# Patient Record
Sex: Female | Born: 1965 | Race: White | Hispanic: No | Marital: Single | Smoking: Current every day smoker
Health system: Southern US, Community
[De-identification: ages and names within clinical notes are randomized; demographics above are authoritative.]

## PROBLEM LIST (undated history)

## (undated) DIAGNOSIS — E039 Hypothyroidism, unspecified: Secondary | ICD-10-CM

## (undated) DIAGNOSIS — F319 Bipolar disorder, unspecified: Secondary | ICD-10-CM

## (undated) DIAGNOSIS — I82409 Acute embolism and thrombosis of unspecified deep veins of unspecified lower extremity: Secondary | ICD-10-CM

## (undated) HISTORY — PX: TONSILLECTOMY: SUR1361

---

## 2004-02-13 ENCOUNTER — Other Ambulatory Visit: Admission: RE | Admit: 2004-02-13 | Discharge: 2004-02-13 | Payer: Self-pay

## 2007-08-12 ENCOUNTER — Emergency Department (HOSPITAL_COMMUNITY): Admission: EM | Admit: 2007-08-12 | Discharge: 2007-08-12 | Payer: Self-pay | Admitting: Emergency Medicine

## 2017-12-03 ENCOUNTER — Encounter (HOSPITAL_COMMUNITY): Payer: Self-pay | Admitting: Internal Medicine

## 2017-12-03 ENCOUNTER — Inpatient Hospital Stay (HOSPITAL_COMMUNITY): Payer: Medicaid - Out of State

## 2017-12-03 ENCOUNTER — Inpatient Hospital Stay (HOSPITAL_COMMUNITY)
Admission: AD | Admit: 2017-12-03 | Discharge: 2017-12-15 | DRG: 987 | Disposition: A | Discharge status: Skilled Nursing Facility | Payer: Medicaid - Out of State | Source: Other Acute Inpatient Hospital | Attending: Internal Medicine | Admitting: Internal Medicine

## 2017-12-03 DIAGNOSIS — F17200 Nicotine dependence, unspecified, uncomplicated: Secondary | ICD-10-CM | POA: Diagnosis not present

## 2017-12-03 DIAGNOSIS — Z91128 Patient's intentional underdosing of medication regimen for other reason: Secondary | ICD-10-CM

## 2017-12-03 DIAGNOSIS — I10 Essential (primary) hypertension: Secondary | ICD-10-CM | POA: Diagnosis present

## 2017-12-03 DIAGNOSIS — R040 Epistaxis: Secondary | ICD-10-CM | POA: Diagnosis not present

## 2017-12-03 DIAGNOSIS — K709 Alcoholic liver disease, unspecified: Secondary | ICD-10-CM | POA: Diagnosis not present

## 2017-12-03 DIAGNOSIS — D65 Disseminated intravascular coagulation [defibrination syndrome]: Secondary | ICD-10-CM | POA: Diagnosis not present

## 2017-12-03 DIAGNOSIS — E872 Acidosis: Secondary | ICD-10-CM | POA: Diagnosis present

## 2017-12-03 DIAGNOSIS — M311 Thrombotic microangiopathy: Secondary | ICD-10-CM

## 2017-12-03 DIAGNOSIS — R0602 Shortness of breath: Secondary | ICD-10-CM | POA: Diagnosis not present

## 2017-12-03 DIAGNOSIS — F1920 Other psychoactive substance dependence, uncomplicated: Secondary | ICD-10-CM | POA: Diagnosis not present

## 2017-12-03 DIAGNOSIS — D759 Disease of blood and blood-forming organs, unspecified: Secondary | ICD-10-CM | POA: Diagnosis not present

## 2017-12-03 DIAGNOSIS — F10231 Alcohol dependence with withdrawal delirium: Secondary | ICD-10-CM | POA: Diagnosis present

## 2017-12-03 DIAGNOSIS — F1010 Alcohol abuse, uncomplicated: Secondary | ICD-10-CM | POA: Diagnosis not present

## 2017-12-03 DIAGNOSIS — G9341 Metabolic encephalopathy: Secondary | ICD-10-CM | POA: Diagnosis not present

## 2017-12-03 DIAGNOSIS — D696 Thrombocytopenia, unspecified: Secondary | ICD-10-CM | POA: Diagnosis not present

## 2017-12-03 DIAGNOSIS — Z781 Physical restraint status: Secondary | ICD-10-CM

## 2017-12-03 DIAGNOSIS — F19121 Other psychoactive substance abuse with intoxication delirium: Secondary | ICD-10-CM | POA: Diagnosis not present

## 2017-12-03 DIAGNOSIS — R740 Nonspecific elevation of levels of transaminase and lactic acid dehydrogenase [LDH]: Secondary | ICD-10-CM | POA: Diagnosis not present

## 2017-12-03 DIAGNOSIS — F1210 Cannabis abuse, uncomplicated: Secondary | ICD-10-CM | POA: Diagnosis present

## 2017-12-03 DIAGNOSIS — R7989 Other specified abnormal findings of blood chemistry: Secondary | ICD-10-CM | POA: Diagnosis present

## 2017-12-03 DIAGNOSIS — D6959 Other secondary thrombocytopenia: Secondary | ICD-10-CM | POA: Diagnosis present

## 2017-12-03 DIAGNOSIS — T370X5A Adverse effect of sulfonamides, initial encounter: Secondary | ICD-10-CM | POA: Diagnosis not present

## 2017-12-03 DIAGNOSIS — R339 Retention of urine, unspecified: Secondary | ICD-10-CM | POA: Diagnosis not present

## 2017-12-03 DIAGNOSIS — T381X6A Underdosing of thyroid hormones and substitutes, initial encounter: Secondary | ICD-10-CM | POA: Diagnosis present

## 2017-12-03 DIAGNOSIS — F1420 Cocaine dependence, uncomplicated: Secondary | ICD-10-CM | POA: Diagnosis not present

## 2017-12-03 DIAGNOSIS — F1410 Cocaine abuse, uncomplicated: Secondary | ICD-10-CM | POA: Diagnosis not present

## 2017-12-03 DIAGNOSIS — D592 Drug-induced nonautoimmune hemolytic anemia: Secondary | ICD-10-CM | POA: Diagnosis not present

## 2017-12-03 DIAGNOSIS — B1920 Unspecified viral hepatitis C without hepatic coma: Secondary | ICD-10-CM | POA: Diagnosis present

## 2017-12-03 DIAGNOSIS — G92 Toxic encephalopathy: Secondary | ICD-10-CM | POA: Diagnosis not present

## 2017-12-03 DIAGNOSIS — F17210 Nicotine dependence, cigarettes, uncomplicated: Secondary | ICD-10-CM | POA: Diagnosis present

## 2017-12-03 DIAGNOSIS — E871 Hypo-osmolality and hyponatremia: Secondary | ICD-10-CM | POA: Diagnosis present

## 2017-12-03 DIAGNOSIS — R791 Abnormal coagulation profile: Secondary | ICD-10-CM | POA: Diagnosis present

## 2017-12-03 DIAGNOSIS — M3119 Other thrombotic microangiopathy: Secondary | ICD-10-CM

## 2017-12-03 DIAGNOSIS — D649 Anemia, unspecified: Secondary | ICD-10-CM | POA: Diagnosis not present

## 2017-12-03 DIAGNOSIS — R5381 Other malaise: Secondary | ICD-10-CM | POA: Diagnosis not present

## 2017-12-03 DIAGNOSIS — D689 Coagulation defect, unspecified: Secondary | ICD-10-CM | POA: Diagnosis not present

## 2017-12-03 DIAGNOSIS — Z79899 Other long term (current) drug therapy: Secondary | ICD-10-CM

## 2017-12-03 DIAGNOSIS — Z79890 Hormone replacement therapy (postmenopausal): Secondary | ICD-10-CM

## 2017-12-03 DIAGNOSIS — F1910 Other psychoactive substance abuse, uncomplicated: Secondary | ICD-10-CM | POA: Diagnosis present

## 2017-12-03 DIAGNOSIS — R531 Weakness: Secondary | ICD-10-CM | POA: Diagnosis not present

## 2017-12-03 DIAGNOSIS — I959 Hypotension, unspecified: Secondary | ICD-10-CM | POA: Diagnosis not present

## 2017-12-03 DIAGNOSIS — E039 Hypothyroidism, unspecified: Secondary | ICD-10-CM | POA: Diagnosis present

## 2017-12-03 DIAGNOSIS — R945 Abnormal results of liver function studies: Secondary | ICD-10-CM | POA: Diagnosis not present

## 2017-12-03 DIAGNOSIS — F319 Bipolar disorder, unspecified: Secondary | ICD-10-CM | POA: Diagnosis present

## 2017-12-03 DIAGNOSIS — R34 Anuria and oliguria: Secondary | ICD-10-CM | POA: Diagnosis present

## 2017-12-03 DIAGNOSIS — T80212A Local infection due to central venous catheter, initial encounter: Secondary | ICD-10-CM

## 2017-12-03 DIAGNOSIS — N179 Acute kidney failure, unspecified: Secondary | ICD-10-CM | POA: Diagnosis present

## 2017-12-03 DIAGNOSIS — F1510 Other stimulant abuse, uncomplicated: Secondary | ICD-10-CM | POA: Diagnosis present

## 2017-12-03 DIAGNOSIS — F1110 Opioid abuse, uncomplicated: Secondary | ICD-10-CM | POA: Diagnosis not present

## 2017-12-03 DIAGNOSIS — T6591XA Toxic effect of unspecified substance, accidental (unintentional), initial encounter: Secondary | ICD-10-CM | POA: Diagnosis present

## 2017-12-03 DIAGNOSIS — K760 Fatty (change of) liver, not elsewhere classified: Secondary | ICD-10-CM | POA: Diagnosis present

## 2017-12-03 DIAGNOSIS — R410 Disorientation, unspecified: Secondary | ICD-10-CM | POA: Diagnosis present

## 2017-12-03 DIAGNOSIS — R251 Tremor, unspecified: Secondary | ICD-10-CM | POA: Diagnosis not present

## 2017-12-03 HISTORY — DX: Bipolar disorder, unspecified: F31.9

## 2017-12-03 HISTORY — DX: Hypothyroidism, unspecified: E03.9

## 2017-12-03 HISTORY — DX: Acute embolism and thrombosis of unspecified deep veins of unspecified lower extremity: I82.409

## 2017-12-03 LAB — URINALYSIS, COMPLETE (UACMP) WITH MICROSCOPIC
Bilirubin Urine: NEGATIVE
GLUCOSE, UA: NEGATIVE mg/dL
Ketones, ur: NEGATIVE mg/dL
LEUKOCYTES UA: NEGATIVE
NITRITE: NEGATIVE
PH: 5 (ref 5.0–8.0)
Protein, ur: NEGATIVE mg/dL
SPECIFIC GRAVITY, URINE: 1.006 (ref 1.005–1.030)

## 2017-12-03 LAB — COMPREHENSIVE METABOLIC PANEL
ALBUMIN: 2.1 g/dL — AB (ref 3.5–5.0)
ALT: 60 U/L — AB (ref 14–54)
AST: 161 U/L — AB (ref 15–41)
Alkaline Phosphatase: 78 U/L (ref 38–126)
Anion gap: 11 (ref 5–15)
BUN: 48 mg/dL — AB (ref 6–20)
CHLORIDE: 101 mmol/L (ref 101–111)
CO2: 17 mmol/L — ABNORMAL LOW (ref 22–32)
CREATININE: 2.73 mg/dL — AB (ref 0.44–1.00)
Calcium: 7.8 mg/dL — ABNORMAL LOW (ref 8.9–10.3)
GFR calc Af Amer: 22 mL/min — ABNORMAL LOW (ref >60)
GFR calc non Af Amer: 19 mL/min — ABNORMAL LOW (ref >60)
GLUCOSE: 118 mg/dL — AB (ref 65–99)
POTASSIUM: 3.8 mmol/L (ref 3.5–5.1)
Sodium: 129 mmol/L — ABNORMAL LOW (ref 135–145)
Total Bilirubin: 3.3 mg/dL — ABNORMAL HIGH (ref 0.3–1.2)
Total Protein: 3.8 g/dL — ABNORMAL LOW (ref 6.5–8.1)

## 2017-12-03 LAB — DIC (DISSEMINATED INTRAVASCULAR COAGULATION) PANEL
APTT: 31 s (ref 24–36)
D DIMER QUANT: 3.38 ug{FEU}/mL — AB (ref 0.00–0.50)
FIBRINOGEN: 152 mg/dL — AB (ref 210–475)
INR: 1.58
PROTHROMBIN TIME: 18.7 s — AB (ref 11.4–15.2)

## 2017-12-03 LAB — CBC WITH DIFFERENTIAL/PLATELET
BASOS ABS: 0 10*3/uL (ref 0.0–0.1)
BASOS PCT: 0 %
Eosinophils Absolute: 0 10*3/uL (ref 0.0–0.7)
Eosinophils Relative: 0 %
HCT: 28.6 % — ABNORMAL LOW (ref 36.0–46.0)
Hemoglobin: 9.7 g/dL — ABNORMAL LOW (ref 12.0–15.0)
Lymphocytes Relative: 13 %
Lymphs Abs: 0.8 10*3/uL (ref 0.7–4.0)
MCH: 33 pg (ref 26.0–34.0)
MCHC: 33.9 g/dL (ref 30.0–36.0)
MCV: 97.3 fL (ref 78.0–100.0)
MONO ABS: 0.2 10*3/uL (ref 0.1–1.0)
Monocytes Relative: 2 %
Neutro Abs: 5.5 10*3/uL (ref 1.7–7.7)
Neutrophils Relative %: 85 %
PLATELETS: 28 10*3/uL — AB (ref 150–400)
RBC: 2.94 MIL/uL — AB (ref 3.87–5.11)
RDW: 16.3 % — AB (ref 11.5–15.5)
WBC: 6.5 10*3/uL (ref 4.0–10.5)

## 2017-12-03 LAB — OSMOLALITY, URINE: OSMOLALITY UR: 299 mosm/kg — AB (ref 300–900)

## 2017-12-03 LAB — RETICULOCYTES
RBC.: 2.94 MIL/uL — ABNORMAL LOW (ref 3.87–5.11)
RETIC COUNT ABSOLUTE: 47 10*3/uL (ref 19.0–186.0)
RETIC CT PCT: 1.6 % (ref 0.4–3.1)

## 2017-12-03 LAB — T4, FREE: Free T4: 0.88 ng/dL (ref 0.61–1.12)

## 2017-12-03 LAB — CREATININE, URINE, RANDOM: Creatinine, Urine: 26.76 mg/dL

## 2017-12-03 LAB — OSMOLALITY: OSMOLALITY: 291 mosm/kg (ref 275–295)

## 2017-12-03 LAB — LACTIC ACID, PLASMA: LACTIC ACID, VENOUS: 1.7 mmol/L (ref 0.5–1.9)

## 2017-12-03 LAB — PHOSPHORUS: PHOSPHORUS: 4.8 mg/dL — AB (ref 2.5–4.6)

## 2017-12-03 LAB — DIC (DISSEMINATED INTRAVASCULAR COAGULATION)PANEL
Platelets: 28 10*3/uL — CL (ref 150–400)
Smear Review: NONE SEEN

## 2017-12-03 LAB — TSH: TSH: 7.108 u[IU]/mL — ABNORMAL HIGH (ref 0.350–4.500)

## 2017-12-03 LAB — NA AND K (SODIUM & POTASSIUM), RAND UR
Potassium Urine: 30 mmol/L
SODIUM UR: 79 mmol/L

## 2017-12-03 LAB — CORTISOL: Cortisol, Plasma: 60 ug/dL

## 2017-12-03 LAB — BRAIN NATRIURETIC PEPTIDE: B NATRIURETIC PEPTIDE 5: 235 pg/mL — AB (ref 0.0–100.0)

## 2017-12-03 LAB — MRSA PCR SCREENING: MRSA BY PCR: NEGATIVE

## 2017-12-03 LAB — MAGNESIUM: MAGNESIUM: 1.6 mg/dL — AB (ref 1.7–2.4)

## 2017-12-03 LAB — VITAMIN B12: VITAMIN B 12: 413 pg/mL (ref 180–914)

## 2017-12-03 LAB — CK: Total CK: 686 U/L — ABNORMAL HIGH (ref 38–234)

## 2017-12-03 LAB — TECHNOLOGIST SMEAR REVIEW: TECH REVIEW: NONE SEEN

## 2017-12-03 LAB — FOLATE: FOLATE: 3.6 ng/mL — AB (ref >5.9)

## 2017-12-03 LAB — ABO/RH: ABO/RH(D): A NEG

## 2017-12-03 LAB — LACTATE DEHYDROGENASE: LDH: 669 U/L — ABNORMAL HIGH (ref 98–192)

## 2017-12-03 MED ORDER — LORAZEPAM 2 MG/ML IJ SOLN
2.0000 mg | INTRAMUSCULAR | Status: DC | PRN
  Administered 2017-12-05: 2 mg via INTRAVENOUS

## 2017-12-03 MED ORDER — STERILE WATER FOR INJECTION IV SOLN
INTRAVENOUS | Status: DC
  Administered 2017-12-03: via INTRAVENOUS
  Filled 2017-12-03 (×2): qty 850

## 2017-12-03 MED ORDER — FOLIC ACID 1 MG PO TABS
1.0000 mg | ORAL_TABLET | Freq: Every day | ORAL | Status: DC
  Administered 2017-12-03 – 2017-12-15 (×12): 1 mg via ORAL
  Filled 2017-12-03 (×12): qty 1

## 2017-12-03 MED ORDER — SODIUM CHLORIDE 0.9 % IV SOLN: 1.0000 mg | Freq: Once | INTRAVENOUS | Status: DC

## 2017-12-03 MED ORDER — ACD FORMULA A 0.73-2.45-2.2 GM/100ML VI SOLN
500.0000 mL | Status: DC
  Administered 2017-12-04: 500 mL via INTRAVENOUS

## 2017-12-03 MED ORDER — ADULT MULTIVITAMIN W/MINERALS CH
1.0000 | ORAL_TABLET | Freq: Every day | ORAL | Status: DC
  Administered 2017-12-03 – 2017-12-15 (×11): 1 via ORAL
  Filled 2017-12-03 (×12): qty 1

## 2017-12-03 MED ORDER — LEVOTHYROXINE SODIUM 112 MCG PO TABS
112.0000 ug | ORAL_TABLET | Freq: Every day | ORAL | Status: DC
  Administered 2017-12-04 – 2017-12-15 (×10): 112 ug via ORAL
  Filled 2017-12-03 (×10): qty 1

## 2017-12-03 MED ORDER — VITAMIN B-1 100 MG PO TABS
100.0000 mg | ORAL_TABLET | Freq: Every day | ORAL | Status: DC
  Administered 2017-12-03 – 2017-12-15 (×12): 100 mg via ORAL
  Filled 2017-12-03 (×12): qty 1

## 2017-12-03 MED ORDER — METHYLPREDNISOLONE SODIUM SUCC 125 MG IJ SOLR
125.0000 mg | Freq: Four times a day (QID) | INTRAMUSCULAR | Status: DC
  Administered 2017-12-04 – 2017-12-05 (×6): 125 mg via INTRAVENOUS
  Filled 2017-12-03 (×6): qty 2

## 2017-12-03 MED ORDER — ACD FORMULA A 0.73-2.45-2.2 GM/100ML VI SOLN
Status: AC
  Filled 2017-12-03: qty 500

## 2017-12-03 MED ORDER — SODIUM CHLORIDE 0.9 % IV SOLN
2.0000 g | Freq: Once | INTRAVENOUS | Status: AC
  Administered 2017-12-04: 2 g via INTRAVENOUS
  Filled 2017-12-03: qty 20

## 2017-12-03 MED ORDER — DIPHENHYDRAMINE HCL 25 MG PO CAPS: 25.0000 mg | ORAL_CAPSULE | Freq: Four times a day (QID) | ORAL | Status: DC | PRN

## 2017-12-03 MED ORDER — ANTICOAGULANT SODIUM CITRATE 4% (200MG/5ML) IV SOLN
5.0000 mL | Freq: Once | Status: AC
  Administered 2017-12-04: 5 mL
  Filled 2017-12-03: qty 250
  Filled 2017-12-03: qty 5

## 2017-12-03 MED ORDER — SODIUM CHLORIDE 0.9% FLUSH
3.0000 mL | Freq: Two times a day (BID) | INTRAVENOUS | Status: DC
  Administered 2017-12-03 – 2017-12-15 (×18): 3 mL via INTRAVENOUS

## 2017-12-03 MED ORDER — ACD FORMULA A 0.73-2.45-2.2 GM/100ML VI SOLN
500.0000 mL | Status: DC
  Filled 2017-12-03: qty 500

## 2017-12-03 MED ORDER — NICOTINE 14 MG/24HR TD PT24
14.0000 mg | MEDICATED_PATCH | Freq: Every day | TRANSDERMAL | Status: DC
  Administered 2017-12-04 – 2017-12-12 (×5): 14 mg via TRANSDERMAL
  Filled 2017-12-03 (×13): qty 1

## 2017-12-03 MED ORDER — ACETAMINOPHEN 325 MG PO TABS
650.0000 mg | ORAL_TABLET | Freq: Four times a day (QID) | ORAL | Status: DC | PRN
  Administered 2017-12-05 – 2017-12-14 (×7): 650 mg via ORAL
  Filled 2017-12-03 (×7): qty 2

## 2017-12-03 MED ORDER — CYANOCOBALAMIN 1000 MCG/ML IJ SOLN
1000.0000 ug | Freq: Once | INTRAMUSCULAR | Status: AC
  Administered 2017-12-04: 1000 ug via SUBCUTANEOUS
  Filled 2017-12-03: qty 1

## 2017-12-03 MED ORDER — ACETAMINOPHEN 650 MG RE SUPP: 650.0000 mg | Freq: Four times a day (QID) | RECTAL | Status: DC | PRN

## 2017-12-03 MED ORDER — DIPHENHYDRAMINE HCL 25 MG PO CAPS
25.0000 mg | ORAL_CAPSULE | Freq: Four times a day (QID) | ORAL | Status: DC | PRN
  Administered 2017-12-04: 25 mg via ORAL

## 2017-12-03 MED ORDER — POLYETHYLENE GLYCOL 3350 17 G PO PACK
17.0000 g | PACK | Freq: Every day | ORAL | Status: DC | PRN
  Administered 2017-12-07 – 2017-12-10 (×2): 17 g via ORAL
  Filled 2017-12-03 (×2): qty 1

## 2017-12-03 MED ORDER — SODIUM CHLORIDE 0.9 % IV SOLN
INTRAVENOUS | Status: DC
  Administered 2017-12-03: 23:00:00 via INTRAVENOUS

## 2017-12-03 MED ORDER — MAGNESIUM SULFATE 2 GM/50ML IV SOLN
2.0000 g | Freq: Once | INTRAVENOUS | Status: AC
  Administered 2017-12-03: 2 g via INTRAVENOUS
  Filled 2017-12-03: qty 50

## 2017-12-03 MED ORDER — ANTICOAGULANT SODIUM CITRATE 4% (200MG/5ML) IV SOLN: 5.0000 mL | Freq: Once | Status: DC

## 2017-12-03 MED ORDER — CALCIUM GLUCONATE 10 % IV SOLN: 2.0000 g | Freq: Once | INTRAVENOUS | Status: DC

## 2017-12-03 MED ORDER — ONDANSETRON HCL 4 MG/2ML IJ SOLN: 4.0000 mg | Freq: Four times a day (QID) | INTRAMUSCULAR | Status: DC | PRN

## 2017-12-03 MED ORDER — ONDANSETRON HCL 4 MG PO TABS: 4.0000 mg | ORAL_TABLET | Freq: Four times a day (QID) | ORAL | Status: DC | PRN

## 2017-12-03 MED ORDER — ACETAMINOPHEN 325 MG PO TABS: 650.0000 mg | ORAL_TABLET | ORAL | Status: DC | PRN

## 2017-12-03 MED ORDER — FOLIC ACID 5 MG/ML IJ SOLN
1.0000 mg | Freq: Once | INTRAMUSCULAR | Status: AC
  Filled 2017-12-03: qty 0.2

## 2017-12-03 MED ORDER — CALCIUM CARBONATE ANTACID 500 MG PO CHEW
2.0000 | CHEWABLE_TABLET | ORAL | Status: DC
  Filled 2017-12-03: qty 2

## 2017-12-03 NOTE — Progress Notes (Signed)
[facility]. Patient alert to self. Foley in place and patent. RIJ infusing IVF. Patient has multiple bruises. CHG bath completed. Denies any pain or discomfort at this time.

## 2017-12-03 NOTE — Procedures (Signed)
Hemodialysis Catheter Insertion Procedure Note Claire LyonsKimberly C Salinas [id number] 08/05/1966  Procedure: Insertion of Hemodialysis Catheter Indications: Plasma Exchange  Procedure Details Consent: Unable to obtain consent because of altered level of consciousness. Time Out: Verified patient identification, verified procedure, site/side was marked, verified correct patient position, special equipment/implants available, medications/allergies/relevent history reviewed, required imaging and test results available.  Performed  Maximum sterile technique was used including antiseptics, cap, gloves, gown, hand hygiene, mask and sheet. Skin prep: Chlorhexidine; local anesthetic administered A antimicrobial bonded/coated single lumen catheter was placed in the left internal jugular vein using the Seldinger technique.  Evaluation Blood flow good Complications: No apparent complications Patient did tolerate procedure well. Chest X-ray ordered to verify placement.  CXR: pending.  Procedure performed under direct ultrasound guidance for real time vessel cannulation.      Rutherford Guysahul Carlos Heber, [place] - [facility] Pgr: [phone]  or [phone] 12/03/2017, 10:56 PM

## 2017-12-03 NOTE — H&P (Addendum)
[facility] History and Physical   Patient: Claire Salinas:[id number]   PCP: Kela Millin, MD DOB: 11-Jun-1966   DOA: 12/03/2017   DOS: 12/03/2017   DOS: the patient was seen and examined on 12/03/2017  Patient coming from: The patient is coming from [facility],  Chief Complaint: she presented from home generalized weakness and confusion.  HPI: Claire Salinas is a 52 y.o. female with Past medical history of hypothyroidism, DVT, bipolar disorder.   Patient initially presented with [facility] home ER as she was unable to urinate for last 2 days as well as unable to walk and very weak and confused at home.  Her presentation date was December 02, 2017.  Also has nausea on arrival there.  Recently seen in November 23, 2017 for At which time treated with Bactrim and UTI. Smokes 1 pack a day.  Drinks beer or wine and liquor, 8 shortness of vodka daily.  History of polysubstance abuse including marijuana, cocaine, meth, methadone.  Also reported easy bruising that started recently. Home medication includes Seroquel 200 mg daily, Neurontin 100 mg 3 times daily, levothyroxine 112 MCG, diclofenac gel and potassium.   Platelets on admission was 114,000, repeat was 55,000 with schistocytes on smear and therefore the patient was sent to [facility]. Renal Ultrasound Shows No Evidence of Hydronephrosis Bilaterally, Left Kidney 7.7 on the right kidney 8.3. Chest x-ray was performed after right IJ line placement no acute abnormality. CT abdomen and pelvis with contrast November 23, 2017 shows fatty liver  Her labs in the other facility were as following. Reticulocyte count 1.73,  WBC 8.9, hemoglobin 11.8, platelet 55. creatinine 2.49, BUN 46, calcium 8.1, total bilirubin 2.1, direct bilirubin 0.8, indirect 1.3. Sodium 130, potassium 4.4, chloride 100, anion gap 20,  INR 1.4, APTT 26.1, LDH 676. urine analysis trace protein, ketones, blood, bilirubin, leukocyte esterase, negative  nitrites. urine sodium 21, urine creatinine 317.9, increased nucleated RBC, schistocytes on smear. pH 7.39, PCO2 21, PO2 87, bicarb 12.7. Lactic acid 2.0.  3.3 on admission. UDS positive for amphetamine, cocaine, negative for barbiturates, benzodiazepine, cannabinoids, methadone, opiates. Ammonia 37,  albumin 2.7, total protein 4.7, AST 121, ALT 55. TSH 8.83,  CK 508,  NT proBNP 3104,  urine culture grew E. coli on 11/23/2017 Received pantoprazole, normal saline bolus, heparin for central line flushes. Bactrim 3 days Didn't Take Synthroid for 3 weeks prior to arrival.  Patient was admitted with above complaints, was given aggressive IV hydration with at least 3 L of normal saline as well as LR.  Patient was also given IV bicarb infusion. Nephrology was consulted, underwent ultrasound of the kidneys which were unremarkable.  Due to worsening platelets patient was sent to [facility] for further workup.  Patient is unable to provide any history or ROS.  She tells me that she is here because of "all her problems".  On further questioning she Repeating Phenergan Works Better  At her baseline ambulates with support And is independent for most of her ADL; manages her medication on her own.  Review of Systems: as mentioned in the history of present illness.  All other systems reviewed and are negative.  Past Medical History:  Diagnosis Date  . Bipolar disorder (HCC)   . DVT (deep venous thrombosis) (HCC)   . Hypothyroidism    Past Surgical History:  Procedure Laterality Date  . TONSILLECTOMY     Social History:  reports that she has been smoking cigarettes.  She  has been smoking about 1.00 pack per day. She does not have any smokeless tobacco history on file. She reports that she drinks about 33.6 oz of alcohol per week. She reports that she uses drugs. Drugs: Cocaine, Marijuana, Methamphetamines, and Amphetamines.  No Known Allergies  Patient is unable to provide any further  family history. No family history on file.   Prior to Admission medications   Not on File    Physical Exam: Vitals:   12/03/17 1724  BP: 103/71  Pulse: (!) 118  Resp: 16  Temp: 98.4 F (36.9 C)  TempSrc: Oral  SpO2: 100%    General: Alert, Awake and Oriented to Time, Place and Person.  Although loses focus quickly and keeps on repeating same line again and again.  Appear in moderate distress, affect flat Eyes: PERRL, Conjunctiva normal ENT: Oral Mucosa clear moist. Neck: difficult to assess JVD, no Abnormal Mass Or lumps Cardiovascular: S1 and S2 Present, no Murmur, Peripheral Pulses Present Respiratory: normal respiratory effort, Bilateral Air entry equal and Decreased, no use of accessory muscle, basal Crackles, no wheezes Abdomen: Bowel Sound present, Soft and no tenderness, no hernia Skin: no redness, multiple bruises at various stages of healing, no other rash, no induration Extremities: trace Pedal edema, no calf tenderness Neurologic: mental status AAOx3, speech normal, attention abnormal, needs multiple redirection as well as keeps on repeating same and again and again. Cranial Nerves PERRL, EOM normal and present, Motor strength bilateral equal, 3 out of 5. Sensation present to painful stimuli,  Reflexes difficult to elicit knee and biceps, babinski difficult to elicit,  Cerebellar test difficult to elicit. Gait not checked due to patient safety concerns.  Labs on Admission:  CBC: No results for input(s): WBC, NEUTROABS, HGB, HCT, MCV, PLT in the last 168 hours. Basic Metabolic Panel: No results for input(s): NA, K, CL, CO2, GLUCOSE, BUN, CREATININE, CALCIUM, MG, PHOS in the last 168 hours. GFR: CrCl cannot be calculated (No order found.). Liver Function Tests: No results for input(s): AST, ALT, ALKPHOS, BILITOT, PROT, ALBUMIN in the last 168 hours. No results for input(s): LIPASE, AMYLASE in the last 168 hours. No results for input(s): AMMONIA in the last 168  hours. Coagulation Profile: No results for input(s): INR, PROTIME in the last 168 hours. Cardiac Enzymes: No results for input(s): CKTOTAL, CKMB, CKMBINDEX, TROPONINI in the last 168 hours. BNP (last 3 results) No results for input(s): PROBNP in the last 8760 hours. HbA1C: No results for input(s): HGBA1C in the last 72 hours. CBG: No results for input(s): GLUCAP in the last 168 hours. Lipid Profile: No results for input(s): CHOL, HDL, LDLCALC, TRIG, CHOLHDL, LDLDIRECT in the last 72 hours. Thyroid Function Tests: No results for input(s): TSH, T4TOTAL, FREET4, T3FREE, THYROIDAB in the last 72 hours. Anemia Panel: No results for input(s): VITAMINB12, FOLATE, FERRITIN, TIBC, IRON, RETICCTPCT in the last 72 hours. Urine analysis: No results found for: COLORURINE, APPEARANCEUR, LABSPEC, PHURINE, GLUCOSEU, HGBUR, BILIRUBINUR, KETONESUR, PROTEINUR, UROBILINOGEN, NITRITE, LEUKOCYTESUR  Radiological Exams on Admission: No results found.  Assessment/Plan 1. AKI (acute kidney injury) (HCC) With anuria. Patient presented from outside hospital with Foley catheter in place. Continue strict in and out. No obstructive uropathy on ultrasound identified. Recheck UA. Patient is currently on normal saline with bicarb infusion, would like to transition her to normal saline. Nephrology consulted will follow recommendation. Most likely combination of prerenal as well as renal etiology. Mild elevation of CK was also seen in the other facility. TTP-HUS is high in differential  as well. Patient may have hypertension since she has been positive for cocaine at home which might have caused renal insufficiency.  2.  Thrombocytopenia. Abnormal INR. Concern for MAHA Patient was primarily transferred to [facility] because for concern for TTP HUS-like syndrome. Hematology has been consulted. Drug-induced possibility due to patient being on Bactrim as well as multiple substance abuse. Hypertension  from cocaine can be also possibility. Infection cannot be ruled out. Workup is initiated here, hematologist has been consulted, appreciate input from Dr. Mosetta Putt He does not appear to be compensating and therefore will monitor clinically overnight.  3.  Acute metabolic encephalopathy. Etiology not clear, probably multifactorial. TTP-HUS, acute metabolic encephalopathy from acute kidney injury, delirium tremens from alcohol withdrawal, substance abuse, hypothyroidism with noncompliance with Synthroid.  All these are actively presenting this patient. We will get CT head to rule out any acute intracranial abnormality. May require MRI to rule out the same as well. PT OT consulted. Avoid psychotropic medications unless indicated. Metabolic workup also initiated including ruling out infection. Patient did pass a swallow evaluation at bedside.  4.  Hypothyroidism. Noncompliance with Synthroid. TSH was 8.18 at the facility. recheck TSH and free T4. May require IV Synthroid. Check cortisol level before that.  May need to give IV Solu-Medrol before that.  5.  Alcohol abuse. Drinks 8 glasses of vodka on daily basis. CIWA protocol. Patient would be in window.  For delirium as well as delirium tremens in the next 24-72 hours.  6.  Polysubstance abuse. Monitor for now. CIWA protocol.  7.  Multiple bruises. Patient denies any injury or assault. Likely from thrombocytopenia as well as ataxia. Monitor. Would need to rediscuss regarding harm at home once patient's mentation is clear.  8.  Active smoker. Nicotine patch for now. Unable to discuss quitting and counseling due to mental status changes.  9.  Elevated LFT. Fatty liver. AST ALT elevation consistent with alcoholic liver disease. Bilirubin elevated with indirect bilirubin primarily elevated consistent with hemolysis. Mild elevation of LDH as well. Recheck the labs. Hep B and hep C. Avoid hepatotoxic medication, Tylenol less than 2 g  okay.  10.  Central line. Presenting from outside facility with a central line present in right IJ. Chest x-ray.  11.  Suspected UTI. Patient was treated with some IV antibiotics in the outside facility I do not have any information available regarding this on MAR. Currently will monitor clinically.   Nutrition: renal diet DVT Prophylaxis: mechanical compression device  Advance goals of care discussion: full code presumed   Consults: Nephrology, Dr. Lowell Guitar. Hematology, Dr. Mosetta Putt  Family Communication: no family was present at bedside, at the time of interview.  Disposition: Admitted as inpatient step-down unit. Likely to be discharged home, in 3-4 days. The patient is critically ill with multiple organ systems failure and requires high complexity decision making for assessment and support, frequent evaluation and titration of therapies. Critical Care Time devoted to patient care services described in this note is 60 minutes   Author: Lynden Oxford, MD [facility] Pager: [phone] 12/03/2017  If 7PM-7AM, please contact night-coverage [email or web address] Password [email or web address]

## 2017-12-03 NOTE — Consult Note (Signed)
Name: Claire Salinas MRN: [id number] DOB: June 17, 1966    ADMISSION DATE:  12/03/2017 CONSULTATION DATE:  12/03/17  REFERRING MD :  Mosetta Putt  CHIEF COMPLAINT:  AMS, hypotension   HISTORY OF PRESENT ILLNESS:  Claire Salinas is a 52 y.o. female with a PMH as outlined below.  She was transferred from [facility] to [facility] for concern of TTP.  She had been seen in [facility] ED 12/24 for right arm numbness and weakness.  Found to have UTI so prescribed Bactrim.  Returned 1/2 with worsening confusion.  Found to have anemia, thrombocytopenia, AKI.  She was subsequently transferred to [facility] for initiation of plasma exchange.  Overnight 1/3, hematology had concern that she needed transfer to ICU for hypotension and AMS.  Lowest SBP has been 94.  Current BP 105/75 with MAP 86.  Pt is awake, moving all extremities, but does not answer questions or follow commands.   PAST MEDICAL HISTORY :   has a past medical history of Bipolar disorder (HCC), DVT (deep venous thrombosis) (HCC), and Hypothyroidism.  has a past surgical history that includes Tonsillectomy. Prior to Admission medications   Medication Sig Start Date End Date Taking? Authorizing Provider  levothyroxine (SYNTHROID, LEVOTHROID) 112 MCG tablet Take 112 mcg by mouth daily before breakfast.   Yes [provider]   No Known Allergies  FAMILY HISTORY:  family history is not on file. SOCIAL HISTORY:  reports that she has been smoking cigarettes.  She has been smoking about 1.00 pack per day. She does not have any smokeless tobacco history on file. She reports that she drinks about 33.6 oz of alcohol per week. She reports that she uses drugs. Drugs: Cocaine, Marijuana, Methamphetamines, and Amphetamines.  REVIEW OF SYSTEMS:  Unable to obtain as pt is encephalopathic.   SUBJECTIVE:  MAP 86.  Pt awake but unable to answer questions.  VITAL SIGNS: Temp:  [98.4 F (36.9 C)-98.5 F (36.9 C)] 98.5 F (36.9 C) (01/03 2013) Pulse Rate:  [118]  118 (01/03 1724) Resp:  [16] 16 (01/03 1724) BP: (103)/(71) 103/71 (01/03 1724) SpO2:  [100 %] 100 % (01/03 1724) Weight:  [70.5 kg (155 lb 6.8 oz)] 70.5 kg (155 lb 6.8 oz) (01/03 1940)  PHYSICAL EXAMINATION: General: Adult female, resting in bed, in NAD. Neuro: Awake, unable to answer questions, moves all extremities, non-focal.  HEENT: [place]/AT. PERRL, sclerae anicteric. Cardiovascular: RRR, no M/R/G.  Lungs: Respirations even and unlabored.  CTA bilaterally, No W/R/R. Abdomen: BS x 4, soft, NT/ND.  Musculoskeletal: No gross deformities, no edema.  Skin: Intact, warm, no rashes.    Recent Labs  Lab 12/03/17 1926  NA 129*  K 3.8  CL 101  CO2 17*  BUN 48*  CREATININE 2.73*  GLUCOSE 118*   Recent Labs  Lab 12/03/17 1926  HGB 9.7*  HCT 28.6*  WBC 6.5  PLT PENDING  28*   Dg Chest Port 1 View  Result Date: 12/03/2017 CLINICAL DATA:  Shortness of breath EXAM: PORTABLE CHEST 1 VIEW COMPARISON:  None. FINDINGS: A right central venous catheter is in place with tip projected over the right clavicular head. The catheter is somewhat laterally positioned but this is likely due to patient rotation. Tip is probably in the upper SVC region. Prominent right paratracheal soft tissues. This is likely vascular but lymphadenopathy is a less likely consideration. No pneumothorax. No blunting of costophrenic angles. No focal consolidation. Linear atelectasis or fibrosis in the left lung base. Heart size and pulmonary vascularity are normal.  IMPRESSION: Right central venous catheter tip is in the upper SVC region. Prominent right paratracheal soft tissues are likely vascular. Lymphadenopathy is a less likely consideration. Linear atelectasis or fibrosis in the left lung base. Electronically Signed   By: Burman NievesWilliam  Stevens M.D.   On: 12/03/2017 20:58    STUDIES:  CXR 1/3 > atelectasis.  SIGNIFICANT EVENTS  1[facility]  ASSESSMENT / PLAN:  Concern for hypotension - current MAP  86. Plan: Continue fluids. No need for ICU transfer at this point.  TTP vs ITP vs DIC. Plan: Continue plasma exchange per hematology. Continue steroids. F/u [id number].  AKI. Hyponatremia - ? Beer potomania. NAGMA. Hypomagnesemia - s/p repletion. Plan: Continue plasma exchange and fluids (started bicarb). Follow BMP.  Acute encephalopathy - likely primarily due to TTP but possibly multifactorial given hx EtOH and polysubstance abuse. Polysubstance abuse - UDS at OSH positive for amphetamine, cocaine.  Plan: Continue thiamine / folate. F/u CT head per primary team. Polysubstance counseling.  No need for ICU transfer at this point.  OK to continue supportive care in SDU with initiation of plasma exchange.  PCCM will sign off, pease call back if needs arise.   Rutherford Guysahul Desai, [place] - [facility] Pager: [phone]  or [phone] 12/03/2017, 10:39 PM

## 2017-12-03 NOTE — Significant Event (Addendum)
Rapid Response Event Note:   Overview: Call Time: 2153 Arrival Time: 2155  Initial Focused Assessment: Neurologic  Per RN, mental status has been fluctuating, at times patient is more responsive, has been confused.  When I walked, patient arouse to voice, followed my commands, could not fully lift any extremity off the bed on command but did to painful stimuli, grips were equal, overall generalized weakness, no gaze, pupils are reactive bilaterally, able to protect airway. Heart and lungs sounds clear, good air movement. CCM PA arrived while I was there to place vas-cath. Nurse appreciates ataxia, I did not. Hematology MD saw patient prior to my arrival. Patient does follow commands but seems to follow them inconsistently (behavior?).   Patient was admitted for multiple issues, AKI (not making urine -? HD), Thrombocytopenia (TTP? HIT ? plasmapheresis - ?), did have a routine CT, will change to STAT for now.   Interventions: -- CT STAT (was routine).   Plan of Care (if not transferred): -- Patient will get CT STAT and then set up for plasmapheresis.  -- Hematology MD consulted CCM, CCM PA was bedside placing vascath -- Will look for CT results once they are reported, CT HEAD was negative -- I briefly saw patient in HD, plasma exchange was completed, patient still confused but more awake.  Event Summary:  End Time 2245  Claire Salinas R

## 2017-12-03 NOTE — Consult Note (Addendum)
[facility]  [phone]   HEMATOLOGY ONCOLOGY INPATIENT CONSULTATION   Claire Salinas  DOB: Sep 17, 1966  MR#: [id number]  CSN#: [id number]    Requesting Physician: [facility] Dr. Allena Katz   Patient Care Team: Kela Millin, MD as PCP - General (Family Medicine)  Reason for consult: possible TTP   History of present illness:   Patient is a 52 year old female, with past medical history of bipolar, polysubstance abuse, hypothyroidism, DVT, [facility].  Patient is confused, disoriented, not able to give history.  Eyes spoke with her aunt and her daughter, and reviewed her chart.  She presented to [facility] on November 23, 2017 for right arm numbness, and generalized weakness.  She was found to have UTI and was treated with Bactrim.  She returned to emergency room yesterday with worsening confusion, unable to void, no fever, lab reviewed mild anemia and thrombocytopenia with platelet count 114k, acute renal failure with creatinine 2.49.  She was admitted, repeated CBC this morning showed platelet 55K, which was transferred to [facility].  Her CBC showed platelet count 28K tonight.  Peripheral smear showed positive schistocytes.   MEDICAL HISTORY:  Past Medical History:  Diagnosis Date  . Bipolar disorder (HCC)   . DVT (deep venous thrombosis) (HCC)   . Hypothyroidism     SURGICAL HISTORY: Past Surgical History:  Procedure Laterality Date  . TONSILLECTOMY      SOCIAL HISTORY: Social History   Socioeconomic History  . Marital status: Single    Spouse name: Not on file  . Number of children: Not on file  . Years of education: Not on file  . Highest education level: Not on file  Social Needs  . Financial resource strain: Not on file  . Food insecurity - worry: Not on file  . Food insecurity - inability: Not on file  . Transportation needs - medical: Not on file  . Transportation needs -  non-medical: Not on file  Occupational History  . Not on file  Tobacco Use  . Smoking status: Current Every Day Smoker    Packs/day: 1.00    Types: Cigarettes  Substance and Sexual Activity  . Alcohol use: Yes    Alcohol/week: 33.6 oz    Types: 56 Shots of liquor per week    Comment: Vodka  . Drug use: Yes    Types: Cocaine, Marijuana, Methamphetamines, Amphetamines  . Sexual activity: Not on file  Other Topics Concern  . Not on file  Social History Narrative  . Not on file    FAMILY HISTORY: No family history on file.  ALLERGIES:  has No Known Allergies.  MEDICATIONS:  Current Facility-Administered Medications  Medication Dose Route Frequency Provider Last Rate Last Dose  . acetaminophen (TYLENOL) tablet 650 mg  650 mg Oral Q6H PRN Rolly Salter, MD       Or  . acetaminophen (TYLENOL) suppository 650 mg  650 mg Rectal Q6H PRN Rolly Salter, MD      . folic acid (FOLVITE) tablet 1 mg  1 mg Oral Daily Rolly Salter, MD   1 mg at 12/03/17 2058  . [START ON 12/04/2017] levothyroxine (SYNTHROID, LEVOTHROID) tablet 112 mcg  112 mcg Oral QAC breakfast Rolly Salter, MD      . LORazepam (ATIVAN) injection 2-3 mg  2-3 mg Intravenous Q1H PRN Rolly Salter, MD      . multivitamin with minerals tablet 1 tablet  1 tablet Oral Daily Rolly SalterPatel, Pranav M, MD   1 tablet at 12/03/17 2057  . nicotine (NICODERM CQ - dosed in mg/24 hours) patch 14 mg  14 mg Transdermal Daily Rolly SalterPatel, Pranav M, MD      . ondansetron [facility](ZOFRAN) tablet 4 mg  4 mg Oral Q6H PRN Rolly SalterPatel, Pranav M, MD       Or  . ondansetron [facility]ZOFRAN) injection 4 mg  4 mg Intravenous Q6H PRN Rolly SalterPatel, Pranav M, MD      . polyethylene glycol (MIRALAX / GLYCOLAX) packet 17 g  17 g Oral Daily PRN Rolly SalterPatel, Pranav M, MD      . sodium chloride flush (NS) 0.9 % injection 3 mL  3 mL Intravenous Q12H Rolly SalterPatel, Pranav M, MD      . thiamine (VITAMIN B-1) tablet 100 mg  100 mg Oral Daily Rolly SalterPatel, Pranav M, MD   100 mg at 12/03/17 2058    REVIEW OF SYSTEMS:   (unable to obtain due to her delirium)  PHYSICAL EXAMINATION: ECOG PERFORMANCE STATUS:   Vitals:   12/03/17 1724 12/03/17 2013  BP: 103/71   Pulse: (!) 118   Resp: 16   Temp: 98.4 F (36.9 C) 98.5 F (36.9 C)  SpO2: 100%    Filed Weights   12/03/17 1940  Weight: 155 lb 6.8 oz (70.5 kg)    GENERAL: Drowsy, arousable, disoriented, able to state her name, does not answer questions appropriately. SKIN: Mild to moderate ecchymosis on her bilateral arms, left lateral leg, and congenital area.  EYES: normal, conjunctiva are pink and non-injected, sclera clear OROPHARYNX:no exudate, no erythema and lips, buccal mucosa, and tongue normal  NECK: supple, thyroid normal size, non-tender, without nodularity LYMPH:  no palpable lymphadenopathy in the cervical, axillary or inguinal LUNGS: clear to auscultation and percussion with normal breathing effort HEART: regular rate & rhythm and no murmurs and no lower extremity edema ABDOMEN:abdomen soft, non-tender and normal bowel sounds Musculoskeletal: (+) Significant bilateral upper extremity edema, mild edema on her legs. PSYCH: alert & oriented x 3 with fluent speech NEURO: disorented, able to move all extremities, does not follow commands.  LABORATORY DATA:  I have reviewed the data as listed Lab Results  Component Value Date   WBC 6.5 12/03/2017   HGB 9.7 (L) 12/03/2017   HCT 28.6 (L) 12/03/2017   MCV 97.3 12/03/2017   PLT 28 (LL) 12/03/2017   Recent Labs    12/03/17 1926  NA 129*  K 3.8  CL 101  CO2 17*  GLUCOSE 118*  BUN 48*  CREATININE 2.73*  CALCIUM 7.8*  GFRNONAA 19*  GFRAA 22*  PROT 3.8*  ALBUMIN 2.1*  AST 161*  ALT 60*  ALKPHOS 78  BILITOT 3.3*    RADIOGRAPHIC STUDIES: I have personally reviewed the radiological images as listed and agreed with the findings in the report. Dg Chest Port 1 View  Result Date: 12/03/2017 CLINICAL DATA:  Shortness of breath EXAM: PORTABLE CHEST 1 VIEW COMPARISON:  None.  FINDINGS: A right central venous catheter is in place with tip projected over the right clavicular head. The catheter is somewhat laterally positioned but this is likely due to patient rotation. Tip is probably in the upper SVC region. Prominent right paratracheal soft tissues. This is likely vascular but lymphadenopathy is a less likely consideration. No pneumothorax. No blunting of costophrenic angles. No focal consolidation. Linear atelectasis or fibrosis in the left lung base. Heart size and pulmonary vascularity are normal. IMPRESSION: Right central venous catheter  tip is in the upper SVC region. Prominent right paratracheal soft tissues are likely vascular. Lymphadenopathy is a less likely consideration. Linear atelectasis or fibrosis in the left lung base. Electronically Signed   By: Burman Nieves M.D.   On: 12/03/2017 20:58    ASSESSMENT & PLAN: Patient is a 52 year old female, with past medical history of bipolar, polysubstance abuse, hypothyroidism, DVT, [facility].  1. 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 Acute delirium  3.  AKI 4.  Polysubstance abuse, including alcohol, cocaine, marijuana, methadone 5. Hyponatremia  6.  Transaminitis, possibly related to alcohol and substance abuse   Recommendations: -I have reviewed her peripheral blood smear, which showed mild polychromasia, and increased schistocytes 2-4/hp -Outside and our lab results reviewed, she has elevated indirect bilirubin, LDH, acute renal failure, mental status change, this is concerning for TTP, although her reticulocyte count is not elevated, which is against hemolysis.  -the other possibility including DIC and ITP, or drug-induced thrombocytopenia (bacterim or polysubstance abuse), she does have low fibrinogen and slightly elevated PT/PTT -I recommend line placement and plasma exchange to be started as soon as possible.  I have spoke with critical care team for line  placement, and nephrology team for plasma exchange tonight -We will start Solu-Medrol 125 mg every 6 hours, plan to taper the dose when her platelet counts recovers well -ADAMTS13 was sent out tonight -Patient is critically ill, I recommend her to be evaluated by critical care team, I spoke with Dr. Darrick Penna  -I spoke with pt's aunt and daughter, consent obtained from her daughter over the phone, witnessed by pt's RN Harrold Donath  -I will f/u closely    All questions were answered. The patient knows to call the clinic with any problems, questions or concerns.      Malachy Mood, MD 12/03/2017 9:29 PM  Cell [phone]

## 2017-12-04 ENCOUNTER — Inpatient Hospital Stay (HOSPITAL_COMMUNITY): Payer: Medicaid - Out of State

## 2017-12-04 ENCOUNTER — Encounter (HOSPITAL_COMMUNITY): Payer: Self-pay | Admitting: *Deleted

## 2017-12-04 DIAGNOSIS — R0602 Shortness of breath: Secondary | ICD-10-CM

## 2017-12-04 DIAGNOSIS — M3119 Other thrombotic microangiopathy: Secondary | ICD-10-CM

## 2017-12-04 DIAGNOSIS — M311 Thrombotic microangiopathy: Secondary | ICD-10-CM

## 2017-12-04 DIAGNOSIS — T80212A Local infection due to central venous catheter, initial encounter: Secondary | ICD-10-CM

## 2017-12-04 LAB — PROTIME-INR
INR: 1.14
PROTHROMBIN TIME: 14.5 s (ref 11.4–15.2)

## 2017-12-04 LAB — THERAPEUTIC PLASMA EXCHANGE (BLOOD BANK)
Plasma Exchange: 1501
Plasma volume needed: 1500
UNIT DIVISION: 0
UNIT DIVISION: 0
UNIT DIVISION: 0
UNIT DIVISION: 0
Unit division: 0
Unit division: 0

## 2017-12-04 LAB — URINE CULTURE: Culture: NO GROWTH

## 2017-12-04 LAB — POCT I-STAT, CHEM 8
BUN: 40 mg/dL — ABNORMAL HIGH (ref 6–20)
CALCIUM ION: 1.24 mmol/L (ref 1.15–1.40)
CHLORIDE: 103 mmol/L (ref 101–111)
Creatinine, Ser: 2.7 mg/dL — ABNORMAL HIGH (ref 0.44–1.00)
Glucose, Bld: 111 mg/dL — ABNORMAL HIGH (ref 65–99)
HCT: 29 % — ABNORMAL LOW (ref 36.0–46.0)
Hemoglobin: 9.9 g/dL — ABNORMAL LOW (ref 12.0–15.0)
Potassium: 4 mmol/L (ref 3.5–5.1)
SODIUM: 131 mmol/L — AB (ref 135–145)
TCO2: 17 mmol/L — AB (ref 22–32)

## 2017-12-04 LAB — RENAL FUNCTION PANEL
ALBUMIN: 2.2 g/dL — AB (ref 3.5–5.0)
Anion gap: 9 (ref 5–15)
BUN: 44 mg/dL — ABNORMAL HIGH (ref 6–20)
CALCIUM: 8.2 mg/dL — AB (ref 8.9–10.3)
CO2: 23 mmol/L (ref 22–32)
Chloride: 101 mmol/L (ref 101–111)
Creatinine, Ser: 2.31 mg/dL — ABNORMAL HIGH (ref 0.44–1.00)
GFR, EST AFRICAN AMERICAN: 27 mL/min — AB (ref >60)
GFR, EST NON AFRICAN AMERICAN: 23 mL/min — AB (ref >60)
GLUCOSE: 109 mg/dL — AB (ref 65–99)
PHOSPHORUS: 4 mg/dL (ref 2.5–4.6)
POTASSIUM: 3.7 mmol/L (ref 3.5–5.1)
SODIUM: 133 mmol/L — AB (ref 135–145)

## 2017-12-04 LAB — CBC
HEMATOCRIT: 25.3 % — AB (ref 36.0–46.0)
HEMOGLOBIN: 8.5 g/dL — AB (ref 12.0–15.0)
MCH: 32.8 pg (ref 26.0–34.0)
MCHC: 33.6 g/dL (ref 30.0–36.0)
MCV: 97.7 fL (ref 78.0–100.0)
Platelets: 24 10*3/uL — CL (ref 150–400)
RBC: 2.59 MIL/uL — AB (ref 3.87–5.11)
RDW: 16.5 % — ABNORMAL HIGH (ref 11.5–15.5)
WBC: 4 10*3/uL (ref 4.0–10.5)

## 2017-12-04 LAB — MAGNESIUM: MAGNESIUM: 2 mg/dL (ref 1.7–2.4)

## 2017-12-04 LAB — HIV ANTIBODY (ROUTINE TESTING W REFLEX): HIV Screen 4th Generation wRfx: NONREACTIVE

## 2017-12-04 MED ORDER — DIPHENHYDRAMINE HCL 25 MG PO CAPS: 25.0000 mg | ORAL_CAPSULE | Freq: Four times a day (QID) | ORAL | Status: DC | PRN

## 2017-12-04 MED ORDER — LACTULOSE 10 GM/15ML PO SOLN
20.0000 g | Freq: Every day | ORAL | Status: DC
  Administered 2017-12-04 – 2017-12-12 (×7): 20 g via ORAL
  Filled 2017-12-04 (×11): qty 30

## 2017-12-04 MED ORDER — ANTICOAGULANT SODIUM CITRATE 4% (200MG/5ML) IV SOLN
5.0000 mL | Freq: Once | Status: AC
  Administered 2017-12-04: 5 mL
  Filled 2017-12-04: qty 250

## 2017-12-04 MED ORDER — ACD FORMULA A 0.73-2.45-2.2 GM/100ML VI SOLN
Status: AC
  Filled 2017-12-04: qty 500

## 2017-12-04 MED ORDER — SODIUM CHLORIDE 0.9 % IV SOLN
2.0000 g | Freq: Once | INTRAVENOUS | Status: AC
  Administered 2017-12-04: 2 g via INTRAVENOUS
  Filled 2017-12-04: qty 20

## 2017-12-04 MED ORDER — CALCIUM CARBONATE ANTACID 500 MG PO CHEW
CHEWABLE_TABLET | ORAL | Status: AC
  Administered 2017-12-04: 400 mg via ORAL
  Filled 2017-12-04: qty 4

## 2017-12-04 MED ORDER — DIPHENHYDRAMINE HCL 25 MG PO CAPS
ORAL_CAPSULE | ORAL | Status: AC
  Filled 2017-12-04: qty 1

## 2017-12-04 MED ORDER — ACETAMINOPHEN 325 MG PO TABS: 650.0000 mg | ORAL_TABLET | ORAL | Status: DC | PRN

## 2017-12-04 MED ORDER — THIAMINE HCL 100 MG/ML IJ SOLN
500.0000 mg | Freq: Once | INTRAVENOUS | Status: AC
  Administered 2017-12-04: 500 mg via INTRAVENOUS
  Filled 2017-12-04: qty 5

## 2017-12-04 MED ORDER — ACD FORMULA A 0.73-2.45-2.2 GM/100ML VI SOLN
500.0000 mL | Status: DC
  Filled 2017-12-04: qty 500

## 2017-12-04 MED ORDER — ORAL CARE MOUTH RINSE
15.0000 mL | Freq: Two times a day (BID) | OROMUCOSAL | Status: DC
  Administered 2017-12-04 – 2017-12-15 (×17): 15 mL via OROMUCOSAL

## 2017-12-04 MED ORDER — CALCIUM CARBONATE ANTACID 500 MG PO CHEW
2.0000 | CHEWABLE_TABLET | ORAL | Status: DC
  Administered 2017-12-04: 400 mg via ORAL

## 2017-12-04 MED ORDER — LACTATED RINGERS IV SOLN
INTRAVENOUS | Status: AC
  Administered 2017-12-04: 10:00:00 via INTRAVENOUS

## 2017-12-04 NOTE — Procedures (Signed)
EEG Report  Clinical History:  Renal failure, hemolytic anemia, thrombocytopenia, and mental status changes.  Suspect TTP.  ADAMTS13 pending.    Technical Summary:  A 19 channel digital EEG recording was performed using the 10-20 international system of electrode placement.  Bipolar and Referential montages were used.  The total recording time was approx 20 minutes.  Findings:  There is no posterior dominant rhythm.  Background frequencies are in the 5-6 Hz range and symmetrical.  There are intermittent triphasic waveforms present in a symmetrical and synchronous fashion.  No epileptiform discharges or electrographic seizures are present.   Impression:  This is an abnormal EEG.  There is moderate generalized slowing of brain activity and characteristic waveforms consistent with a metabolic, toxic, infectious, or hypoxic encephalopathy.  Clinical correlation is recommended.  The patient is not in non-convulsive status epilepticus.    Weston SettleShervin Herschell Virani, MS, MD

## 2017-12-04 NOTE — Progress Notes (Addendum)
Pt. Exhibiting fluctuation in LOC and mental status. Pt. more difficult to arouse and difficulty maintaining eye contact. Pt. exhibiting additional weakness in extremities and inconsistently following commands. Rapid response nurse was contacted, and responded to bedside. Orders placed. RN will continue to monitor.
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
## 2017-12-04 NOTE — Consult Note (Signed)
Geoffery LyonsKimberly C Coppedge Admit Date: 12/03/2017 12/04/2017 Arita MissSANFORD, RYAN B Requesting Physician:  Allena KatzPatel MD  Reason for Consult:  AKI HPI:  52 year old female who [facility] and transferred to [facility] yesterday evening for evaluation of acute kidney injury with thrombocytopenia.  PMH Incudes:  Bipolar disorder currently on Seroquel and Neurontin  Hypothyroidism on levothyroxine   history of DVT  Polysubstance abuse, outside toxicology screen positive for cocaine, amphetamines, barbiturates, cannabinoids  Tobacco user  Per chart patient was seen on 12/24 at an outside emergency room and treated for a urinary tract infection with TMP/SMX.  She then represented on 1/2 with inability to walk, progressive weakness, confusion, nausea and vomiting.  Her creatinine appears to be 1.1 at least on baseline in late December.  Upon presentation her creatinine was increased to 2.49 with a BUN of 46, potassium 4.4, sodium 130.  She had progressive thrombocytopenia with a value of 55 and a hemoglobin of 11.8 at the outside facility.  She was seen by nephrology at the outside hospital.  Renal ultrasound by report was performed demonstrating no hydronephrosis.  Foley catheter also has been placed.  Upon arrival the patient has had confusion.  Blood pressures are not elevated.  There is no diarrhea.  No fevers.  Her thrombocytopenia has worsened to 24 and her hemoglobin is 8.5 with an LDH of 669 (elevated), INR 1.58, elevated d-dimer.  Her PTT is within normal limits.  Urinalysis is without proteinuria, hematuria.  She was seen by hematology overnight and given that TTP is a possible diagnosis has been started on plasmapheresis.  Testing for ADAMTS 13 and complements are pending.  She has made 0.5 L of urine since admission with more in the bag at the current time.  This morning her creatinine is 2.70 with a BUN of 40 and normal potassium.  She is on a D5 with  sodium bicarbonate continuous infusion currently.   Creatinine, Ser (mg/dL)  Date Value  16/10/960401/03/2018 2.31 (H)  12/04/2017 2.70 (H)  12/03/2017 2.73 (H)  ] I/Os:  ROS NSAIDS: unknown IV Contrast no idenitfied exposure TMP/SMX was prescribed 12/24 for urinary tract infection, unclear if she took over the time courses. Hypotension not present Balance of 12 systems is negative w/ exceptions as above  PMH  Past Medical History:  Diagnosis Date  . Bipolar disorder (HCC)   . DVT (deep venous thrombosis) (HCC)   . Hypothyroidism    PSH  Past Surgical History:  Procedure Laterality Date  . TONSILLECTOMY     FH No family history on file. SH  reports that she has been smoking cigarettes.  She has been smoking about 1.00 pack per day. She does not have any smokeless tobacco history on file. She reports that she drinks about 33.6 oz of alcohol per week. She reports that she uses drugs. Drugs: Cocaine, Marijuana, Methamphetamines, and Amphetamines. Allergies No Known Allergies Home medications Prior to Admission medications   Medication Sig Start Date End Date Taking? Authorizing Provider  levothyroxine (SYNTHROID, LEVOTHROID) 112 MCG tablet Take 112 mcg by mouth daily before breakfast.   Yes [provider]    Current Medications Scheduled Meds: . folic acid  1 mg Oral Daily  . [COMPLETED] folic acid  1 mg Intravenous Once  . levothyroxine  112 mcg Oral QAC breakfast  . methylPREDNISolone (SOLU-MEDROL) injection  125 mg Intravenous Q6H  . multivitamin with minerals  1 tablet Oral Daily  . nicotine  14 mg Transdermal Daily  . sodium chloride flush  3 mL Intravenous Q12H  . thiamine  100 mg Oral Daily   Continuous Infusions: . citrate dextrose    .  sodium bicarbonate (isotonic) infusion in sterile water 75 mL/hr at 12/03/17 2351   PRN Meds:.acetaminophen **OR** acetaminophen, acetaminophen, diphenhydrAMINE, LORazepam, ondansetron **OR** ondansetron (ZOFRAN) IV,  polyethylene glycol  CBC Recent Labs  Lab 12/03/17 1926 12/04/17 0011 12/04/17 0500  WBC 6.5  --  4.0  NEUTROABS 5.5  --   --   HGB 9.7* 9.9* 8.5*  HCT 28.6* 29.0* 25.3*  MCV 97.3  --  97.7  PLT 28*  28*  --  24*   Basic Metabolic Panel Recent Labs  Lab 12/03/17 1926 12/04/17 0011 12/04/17 0500  NA 129* 131* 133*  K 3.8 4.0 3.7  CL 101 103 101  CO2 17*  --  23  GLUCOSE 118* 111* 109*  BUN 48* 40* 44*  CREATININE 2.73* 2.70* 2.31*  CALCIUM 7.8*  --  8.2*  PHOS 4.8*  --  4.0    Physical Exam  Blood pressure 120/81, pulse (!) 108, temperature 98.3 F (36.8 C), temperature source Axillary, resp. rate 10, height 5\' 3"  (1.6 m), weight 71.6 kg (157 lb 13.6 oz), SpO2 98 %. GEN: somnolent, awakens to voice. ENT: NCAT EYES: EOMI CV: RRR  PULM: CTAB ant auscultation ABD: s/nt/nd SKIN: scattered brusing throughout trunk / extremities EXT:No LEE   Assessment 37F admitted 1/2 to OSH with AKI (BL SCr 1.1?), progressive TCP, AMS, in setting of BPAD and ongoing active substance abuse  1. Oliguric AKI, OSH Renal US reported no obstruction/HN and foley catheter placed; BL SCr at least 1.1.  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 MAHA / TCP; hematology following; very possible this is TTP and TPE orderes written by hematology, starting today.  ? If could be related to quetiapine, did she inject narcotics/opana (reported in literature).  No diarrhea reported, BP is not elevated.  C3 and C4 and ADAMTS13 pending.  INR is mildly increased and fibrinogen depressed/Ddmier up.   3. AMS, probably related to #2, substance use issues 4. BPAD on seroquel and gabapentin as outpatient; both on hold here 5. Hypothyroidism 6. Polysubstance abuse; OSH + for amphetamines, barbituates, THC, cocaine  7. Tobacco use  Plan 1. TPE and steroids per hematology 2. No HD needed currently,Good UOP, Lytes ok 3. Cont supportive care, change IVfs to LR from NaHCO3 4. Daily  weights, Daily Renal Panel, Strict I/Os, Avoid nephrotoxins (NSAIDs, judicious IV Contrast) 5. Will follow closely   Sabra Heck MD [phone] pgr 12/04/2017, 9:44 AM

## 2017-12-04 NOTE — Progress Notes (Signed)
Physical Therapy EVALUATION  Clinical Assessment: Pt presented supine in bed with HOB elevated, asleep and remained lethargic throughout. Pt with minimal arousal with movement in bed and to EOB. Pt unable to provide any history information regarding living situation or PLOF. No family/caregivers present during evaluation. Pt currently requires total A x2 for bed mobility and min-max A x2 to maintain upright sitting position. Pt would continue to benefit from skilled physical therapy services at this time while admitted and after d/c to address the below listed limitations in order to improve overall safety and independence with functional mobility.  Of note, with movement pt became tachycardic (HR as high as low 140's). Pt's RN was present throughout evaluation and aware.    12/04/17 0800  PT Visit Information  Last PT Received On 12/04/17  Assistance Needed +2  PT/OT/SLP Co-Evaluation/Treatment Yes  Reason for Co-Treatment Necessary to address cognition/behavior during functional activity;For patient/therapist safety;To address functional/ADL transfers  PT goals addressed during session Mobility/safety with mobility;Balance;Strengthening/ROM  History of Present Illness Pt is [age] y/o female admitted as a transfer from [facility] for concern of TTP.  She was seen in [place] ED on 12/24 for right arm numbness and weakness. Pt was found to have a UTI and prescribed Bactrim. She returned on 1/2 with worsening confusion. Pt found to have anemia, thrombocytopenia and an AKI. PMH including but not limited to Bipolar disorder and polysubstance abuse.  Precautions  Precautions Fall  Restrictions  Weight Bearing Restrictions No  Home Living  Family/patient expects to be discharged to: Unsure  Additional Comments pt too lethargic to provide any information, no family/caregivers present  Prior Function  Comments unsure, see above comment  Communication  Communication No difficulties  Pain  Assessment  Pain Assessment No/denies pain  Cognition  Arousal/Alertness Lethargic  Behavior During Therapy Flat affect  Overall Cognitive Status Difficult to assess  Area of Impairment Attention;Following commands;Problem solving  Current Attention Level Focused  Following Commands Follows one step commands with increased time;Follows one step commands inconsistently  Problem Solving Slow processing;Decreased initiation;Difficulty sequencing;Requires verbal cues;Requires tactile cues  Difficult to assess due to Level of arousal  Upper Extremity Assessment  Upper Extremity Assessment Defer to OT evaluation  Lower Extremity Assessment  Lower Extremity Assessment Generalized weakness  Bed Mobility  Overal bed mobility Needs Assistance  Bed Mobility Supine to Sit;Sit to Supine  Supine to sit Total assist;+2 for physical assistance  Sit to supine Total assist;+2 for physical assistance  General bed mobility comments total A for all aspects secondary to pt too lethargic  Transfers  General transfer comment unable  Balance  Overall balance assessment Needs assistance  Sitting-balance support Feet supported  Sitting balance-Leahy Scale Poor  Sitting balance - Comments fluctuating between requiring max A to min A x2; pt with one LOB posteriorly  Postural control Posterior lean  PT - End of Session  Activity Tolerance Patient limited by lethargy  Patient left in bed;with call bell/phone within reach;with bed alarm set  Nurse Communication Mobility status;Need for lift equipment  PT Assessment  PT Recommendation/Assessment Patient needs continued PT services  PT Visit Diagnosis Other abnormalities of gait and mobility (R26.89);Muscle weakness (generalized) (M62.81)  PT Problem List Decreased strength;Decreased activity tolerance;Decreased balance;Decreased mobility;Decreased coordination;Decreased cognition;Decreased safety awareness;Decreased knowledge of precautions  PT Plan  PT  Frequency (ACUTE ONLY) Min 2X/week  PT Treatment/Interventions (ACUTE ONLY) DME instruction;Stair training;Gait training;Functional mobility training;Therapeutic activities;Therapeutic exercise;Balance training;Neuromuscular re-education;Cognitive remediation;Patient/family education  AM-PAC PT "6 Clicks" Daily Activity Outcome  Measure  Difficulty turning over in bed (including adjusting bedclothes, sheets and blankets)? 1  Difficulty moving from lying on back to sitting on the side of the bed?  1  Difficulty sitting down on and standing up from a chair with arms (e.g., wheelchair, bedside commode, etc,.)? 1  Help needed moving to and from a bed to chair (including a wheelchair)? 1  Help needed walking in hospital room? 1  Help needed climbing 3-5 steps with a railing?  1  6 Click Score 6  Mobility G Code  CN  PT Recommendation  Follow Up Recommendations SNF;Supervision/Assistance - 24 hour  PT equipment None recommended by PT  Individuals Consulted  Consulted and Agree with Results and Recommendations Patient unable/family or caregiver not available  Acute Rehab PT Goals  Patient Stated Goal unable to state  PT Goal Formulation Patient unable to participate in goal setting  Time For Goal Achievement 12/18/17  Potential to Achieve Goals Fair  PT Time Calculation  PT Start Time (ACUTE ONLY) 0820  PT Stop Time (ACUTE ONLY) 0981  PT Time Calculation (min) (ACUTE ONLY) 13 min  PT General Charges  $$ ACUTE PT VISIT 1 Visit  PT Evaluation  $PT Eval Moderate Complexity 1 [address] [place], [place] [phone]

## 2017-12-04 NOTE — Care Management Note (Signed)
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                 Action/Plan: NCM will follow for dc needs.   Expected Discharge Date:                  Expected Discharge Plan:     In-House Referral:     Discharge planning Services  CM Consult  Post Acute Care Choice:    Choice offered to:     DME Arranged:    DME Agency:     HH Arranged:    HH Agency:     Status of Service:  In process, will continue to follow  If discussed at Long Length of Stay Meetings, dates discussed:    Additional Comments:  Leone Havenaylor, Ayen Viviano Clinton, RN 12/04/2017, 2:20 PM

## 2017-12-04 NOTE — Progress Notes (Signed)
Claire Salinas   DOB:1965-12-04   DT#:[id number]   DXI#:[id number]  Hematology follow up note   Subjective: Patient underwent first plasma exchange last night, and the second session this afternoon, tolerated both very well.  She is much more awake, but still disoriented, repeatedly saying the same sentence, and does not answer questions appropriately.  Her vital signs stable, blood pressure improved, has good urine output.   Objective:  Vitals:   12/04/17 1900 12/04/17 1921  BP: 123/76   Pulse: (!) 117   Resp: 19   Temp:  98.5 F (36.9 C)  SpO2: 97%     Body mass index is 27.96 kg/m.  Intake/Output Summary (Last 24 hours) at 12/04/2017 2047 Last data filed at 12/04/2017 1926 Gross per 24 hour  Intake 839.25 ml  Output 1200 ml  Net -360.75 ml     Sclerae unicteric  Oropharynx clear  No peripheral adenopathy  Lungs clear -- no rales or rhonchi  Heart regular rate and rhythm  Abdomen benign  MSK no focal spinal tenderness, (+) anasarca in all extremities   Neuro disoriented, does not follow commands, moves all extremities   CBG (last 3)  No results for input(s): GLUCAP in the last 72 hours.   Labs:  CBC Latest Ref Rng & Units 12/04/2017 12/04/2017 12/03/2017  WBC 4.0 - 10.5 K/uL 4.0 - -  Hemoglobin 12.0 - 15.0 g/dL 8.5(L) 9.9(L) -  Hematocrit 36.0 - 46.0 % 25.3(L) 29.0(L) -  Platelets 150 - 400 K/uL 24(LL) - 28(LL)    CMP Latest Ref Rng & Units 12/04/2017 12/04/2017 12/03/2017  Glucose 65 - 99 mg/dL 109(H) 111(H) 118(H)  BUN 6 - 20 mg/dL 44(H) 40(H) 48(H)  Creatinine 0.44 - 1.00 mg/dL 2.31(H) 2.70(H) 2.73(H)  Sodium 135 - 145 mmol/L 133(L) 131(L) 129(L)  Potassium 3.5 - 5.1 mmol/L 3.7 4.0 3.8  Chloride 101 - 111 mmol/L 101 103 101  CO2 22 - 32 mmol/L 23 - 17(L)  Calcium 8.9 - 10.3 mg/dL 8.2(L) - 7.8(L)  Total Protein 6.5 - 8.1 g/dL - - 3.8(L)  Total Bilirubin 0.3 - 1.2 mg/dL - - 3.3(H)  Alkaline Phos 38 - 126 U/L - - 78  AST 15 - 41 U/L - - 161(H)  ALT 14 - 54 U/L - - 60(H)     Urine Studies No results for input(s): UHGB, CRYS in the last 72 hours.  Invalid input(s): UACOL, UAPR, USPG, UPH, UTP, UGL, UKET, UBIL, UNIT, UROB, ULEU, UEPI, UWBC, URBC, UBAC, CAST, [place], [place]  Basic Metabolic Panel: Recent Labs  Lab 12/03/17 1926 12/04/17 0011 12/04/17 0500  NA 129* 131* 133*  K 3.8 4.0 3.7  CL 101 103 101  CO2 17*  --  23  GLUCOSE 118* 111* 109*  BUN 48* 40* 44*  CREATININE 2.73* 2.70* 2.31*  CALCIUM 7.8*  --  8.2*  MG 1.6*  --  2.0  PHOS 4.8*  --  4.0   GFR Estimated Creatinine Clearance: 27 mL/min (A) (by C-G formula based on SCr of 2.31 mg/dL (H)). Liver Function Tests: Recent Labs  Lab 12/03/17 1926 12/04/17 0500  AST 161*  --   ALT 60*  --   ALKPHOS 78  --   BILITOT 3.3*  --   PROT 3.8*  --   ALBUMIN 2.1* 2.2*   No results for input(s): LIPASE, AMYLASE in the last 168 hours. No results for input(s): AMMONIA in the last 168 hours. Coagulation profile Recent Labs  Lab 12/03/17 1926 12/04/17 0500  INR 1.58 1.14    CBC: Recent Labs  Lab 12/03/17 1926 12/04/17 0011 12/04/17 0500  WBC 6.5  --  4.0  NEUTROABS 5.5  --   --   HGB 9.7* 9.9* 8.5*  HCT 28.6* 29.0* 25.3*  MCV 97.3  --  97.7  PLT 28*  28*  --  24*   Cardiac Enzymes: Recent Labs  Lab 12/03/17 1926  CKTOTAL 686*   BNP: Invalid input(s): POCBNP CBG: No results for input(s): GLUCAP in the last 168 hours. D-Dimer Recent Labs    12/03/17 1926  DDIMER 3.38*   Hgb A1c No results for input(s): HGBA1C in the last 72 hours. Lipid Profile No results for input(s): CHOL, HDL, LDLCALC, TRIG, CHOLHDL, LDLDIRECT in the last 72 hours. Thyroid function studies Recent Labs    12/03/17 1830  TSH 7.108*   Anemia work up Recent Labs    12/03/17 1830 12/03/17 1926  VITAMINB12  --  45  FOLATE 3.6*  --   RETICCTPCT  --  1.6   Microbiology Recent Results (from the past 240 hour(s))  MRSA PCR Screening     Status: None   Collection Time: 12/03/17  5:33 PM   Result Value Ref Range Status   MRSA by PCR NEGATIVE NEGATIVE Final    Comment:        The GeneXpert MRSA Assay (FDA approved for NASAL specimens only), is one component of a comprehensive MRSA colonization surveillance program. It is not intended to diagnose MRSA infection nor to guide or monitor treatment for MRSA infections.   Culture, Urine     Status: None   Collection Time: 12/03/17  6:15 PM  Result Value Ref Range Status   Specimen Description URINE, RANDOM  Final   Special Requests NONE  Final   Culture NO GROWTH  Final   Report Status 12/04/2017 FINAL  Final  Culture, blood (routine x 2)     Status: None (Preliminary result)   Collection Time: 12/03/17  9:03 PM  Result Value Ref Range Status   Specimen Description BLOOD LEFT ANTECUBITAL  Final   Special Requests IN PEDIATRIC BOTTLE Blood Culture adequate volume  Final   Culture NO GROWTH < 24 HOURS  Final   Report Status PENDING  Incomplete  Culture, blood (routine x 2)     Status: None (Preliminary result)   Collection Time: 12/03/17  9:03 PM  Result Value Ref Range Status   Specimen Description BLOOD RIGHT HAND  Final   Special Requests IN PEDIATRIC BOTTLE Blood Culture adequate volume  Final   Culture NO GROWTH < 24 HOURS  Final   Report Status PENDING  Incomplete      Studies:  Ct Head Wo Contrast  Result Date: 12/03/2017 CLINICAL DATA:  Initial evaluation for acute altered mental status. EXAM: CT HEAD WITHOUT CONTRAST TECHNIQUE: Contiguous axial images were obtained from the base of the skull through the vertex without intravenous contrast. COMPARISON:  None available. FINDINGS: Brain: Cerebral volume within normal limits for patient age. Mild chronic small vessel ischemic changes within the cerebral white matter. No evidence for acute intracranial hemorrhage. No findings to suggest acute large vessel territory infarct. No mass lesion, midline shift, or mass effect. Ventricles are normal in size without  evidence for hydrocephalus. No extra-axial fluid collection identified. Vascular: No hyperdense vessel identified. Skull: Scalp soft tissues demonstrate no acute abnormality.Calvarium intact. Soft tissue calcification noted at the left frontal scalp near the vertex, of doubtful significance. Sinuses/Orbits: Globes and orbital soft  tissues are within normal limits. Visualized paranasal sinuses are clear. No mastoid effusion. IMPRESSION: 1. No acute intracranial abnormality. 2. Mild chronic microvascular ischemic disease. Electronically Signed   By: Jeannine Boga M.D.   On: 12/03/2017 23:22   Dg Chest Port 1 View  Result Date: 12/03/2017 CLINICAL DATA:  Shortness of breath EXAM: PORTABLE CHEST 1 VIEW COMPARISON:  None. FINDINGS: A right central venous catheter is in place with tip projected over the right clavicular head. The catheter is somewhat laterally positioned but this is likely due to patient rotation. Tip is probably in the upper SVC region. Prominent right paratracheal soft tissues. This is likely vascular but lymphadenopathy is a less likely consideration. No pneumothorax. No blunting of costophrenic angles. No focal consolidation. Linear atelectasis or fibrosis in the left lung base. Heart size and pulmonary vascularity are normal. IMPRESSION: Right central venous catheter tip is in the upper SVC region. Prominent right paratracheal soft tissues are likely vascular. Lymphadenopathy is a less likely consideration. Linear atelectasis or fibrosis in the left lung base. Electronically Signed   By: Lucienne Capers M.D.   On: 12/03/2017 20:58   Dg Chest Port 1v Same Day  Result Date: 12/03/2017 CLINICAL DATA:  Central line insertion.  Site infection. EXAM: PORTABLE CHEST 1 VIEW COMPARISON:  12/03/2017 FINDINGS: Since the previous study, there is interval placement of a left central venous catheter with tip over the low SVC region. No pneumothorax. An indwelling right central venous catheter is  unchanged in position with tip over the upper SVC region. Shallow inspiration with linear atelectasis in the lung bases. Heart size and pulmonary vascularity are normal. Prominent right paratracheal soft tissue is likely vascular and may be related to patient rotation. IMPRESSION: Left central venous catheter with tip over the low SVC region. Right central venous catheter with tip over the upper SVC region. Shallow inspiration with atelectasis in lung bases. Electronically Signed   By: Lucienne Capers M.D.   On: 12/03/2017 22:52    Assessment: 52 y.o.  female, with past medical history of bipolar, polysubstance abuse, hypothyroidism, DVT, was transferred from outside hospital for presumed TTP.  1. 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 Acute metabolic encephalopathy, multifactorial, including TTP, AKI, polysubstance abuse, elevated amonia, and hypothyroidism 3.  AKI 4.  Polysubstance abuse, including alcohol, cocaine, marijuana, methadone 5. Hyponatremia  6.  Transaminitis, possibly related to alcohol and substance abuse 7. Coagulapathy (low fibrinogen, mildly elevated PT), DIC vs liver etiology  8.  Transaminitis and elevated amonia, likely secondary to alcohol and polysubstance abuse   Recommendations: -I think TTP is still likely the case, but her lab findings were not all consistent with TTP (low ret count, few schistocytes on smear), she does have some lab evidence of DIC, and medication (bactrim) and all subsequent substance induced thrombocytopenia also possibility. Regardless, given her severe acute encephalopathy, I would continue current TTP treatment, before ADMTS13 result came back. Her mental status has improved since last night, much more awake  -continue daily and Solu-Medrol 125 mg every 6 hours -Daily CBC+ret, will repeat fibrinogen and LDH tomorrow  -I have checked with lab and confirmed that ADMTS13 was sent to [place] this morning, result is pending  -no platelet  transfusion unless significant bleeding -if hb below 8.0, will consider blood transfusion -will f/u daily    Truitt Merle, MD 12/04/2017

## 2017-12-04 NOTE — Evaluation (Signed)
Occupational Therapy Evaluation Patient Details Name: Geoffery LyonsKimberly C Key MRN: [id number] DOB: 06/24/1966 Today's Date: 12/04/2017    History of Present Illness Geoffery LyonsKimberly C Shelton is a 52 y.o. female with Past medical history of hypothyroidism, DVT, bipolar disorder, alcohol; uses drugs. Drugs: Cocaine, Marijuana, Methamphetamines, and Amphetamines. [facility] for concern of TTP.  She had been [facility]/24 for right arm numbness and weakness.  Found to have UTI so prescribed Bactrim.  Returned 1/2 with worsening confusion.  Found to have anemia, thrombocytopenia, AKI. Overnight 1/3, hematology had concern that she needed transfer to ICU for hypotension and AMS.   Clinical Impression   Pt with limited eval due to lethargy. Pt total A +2 for bed mobility at this time and total A for all ADL at this time. Therapists attempted different positions (gravity elminated, supported sitting EOB) to increase participation and arousal. No BUE movement at this time to multimodal cues with edema noted. Pillows placed under arms at EOS. Pt will require skilled OT in the acute setting with SNF level therapy afterwards (will monitor for progress if lethargy improves).     Follow Up Recommendations  SNF;Supervision/Assistance - 24 hour;Other (comment)(watch for progress)    Equipment Recommendations  Other (comment)(defer to next venue)    Recommendations for Other Services       Precautions / Restrictions Precautions Precautions: Fall Restrictions Weight Bearing Restrictions: No      Mobility Bed Mobility Overal bed mobility: Needs Assistance Bed Mobility: Supine to Sit;Sit to Supine     Supine to sit: Total assist;+2 for physical assistance Sit to supine: Total assist;+2 for physical assistance   General bed mobility comments: total A for all aspects secondary to pt too lethargic  Transfers                 General transfer comment: unsafe/unable  to attempt    Balance Overall balance assessment: Needs assistance Sitting-balance support: Feet supported Sitting balance-Leahy Scale: Poor Sitting balance - Comments: fluctuating between requiring max A to min A; pt with one LOB posteriorly Postural control: Posterior lean                                 ADL either performed or assessed with clinical judgement   ADL Overall ADL's : Needs assistance/impaired                                       General ADL Comments: Pt is currently a total A for all ADL at this time despite multimodal cueing, HOH attempts to get the Pt to participate, different positions (sitting EOB, supported in supine/gravity eliminated)     Vision Patient Visual Report: Other (comment)(unsure of prior vision)       Perception     Praxis      Pertinent Vitals/Pain Pain Assessment: No/denies pain     Hand Dominance     Extremity/Trunk Assessment Upper Extremity Assessment Upper Extremity Assessment: RUE deficits/detail;LUE deficits/detail;Generalized weakness RUE Deficits / Details: no active motion noted, edema present RUE: (unable to fully assess due to arousal) RUE Coordination: decreased fine motor;decreased gross motor LUE Deficits / Details: no active motion noted, edema present LUE: (unable to fully assess due to arousal) LUE Coordination: decreased fine motor;decreased gross motor   Lower Extremity Assessment Lower Extremity  Assessment: Defer to PT evaluation       Communication Communication Communication: No difficulties   Cognition Arousal/Alertness: Lethargic Behavior During Therapy: Flat affect Overall Cognitive Status: Difficult to assess Area of Impairment: Attention;Following commands;Problem solving                   Current Attention Level: Focused   Following Commands: Follows one step commands with increased time;Follows one step commands inconsistently     Problem Solving: Slow  processing;Decreased initiation;Difficulty sequencing;Requires verbal cues;Requires tactile cues General Comments: Pt was able to shake head that she did not want food, responds mostly yes or no to questions, but inconsistent in response.    General Comments       Exercises     Shoulder Instructions      Home Living Family/patient expects to be discharged to:: Unsure                                 Additional Comments: pt too lethargic to provide any information, no family/caregivers present      Prior Functioning/Environment          Comments: unsure, see above comment        OT Problem List: Decreased strength;Decreased activity tolerance;Decreased range of motion;Impaired balance (sitting and/or standing);Decreased coordination;Decreased cognition;Decreased safety awareness;Impaired UE functional use;Increased edema      OT Treatment/Interventions: Self-care/ADL training;Therapeutic exercise;Neuromuscular education;Manual therapy;DME and/or AE instruction;Therapeutic activities;Cognitive remediation/compensation;Visual/perceptual remediation/compensation;Patient/family education;Balance training    OT Goals(Current goals can be found in the care plan section) Acute Rehab OT Goals Patient Stated Goal: unable to state OT Goal Formulation: Patient unable to participate in goal setting ADL Goals Pt Will Perform Grooming: with mod assist;bed level Additional ADL Goal #1: Pt will sit EOB with min guard assist for 10 min to participate in ADL activities Additional ADL Goal #2: Pt will follow simple one step commands 50% of the time  OT Frequency: Min 2X/week   Barriers to D/C:            Co-evaluation PT/OT/SLP Co-Evaluation/Treatment: Yes Reason for Co-Treatment: Necessary to address cognition/behavior during functional activity;For patient/therapist safety;To address functional/ADL transfers PT goals addressed during session: Mobility/safety with  mobility OT goals addressed during session: ADL's and self-care      AM-PAC PT "6 Clicks" Daily Activity     Outcome Measure Help from another person eating meals?: Total Help from another person taking care of personal grooming?: Total Help from another person toileting, which includes using toliet, bedpan, or urinal?: Total Help from another person bathing (including washing, rinsing, drying)?: Total Help from another person to put on and taking off regular upper body clothing?: Total Help from another person to put on and taking off regular lower body clothing?: Total 6 Click Score: 6   End of Session Equipment Utilized During Treatment: Oxygen Nurse Communication: Mobility status;Precautions  Activity Tolerance: Patient limited by lethargy Patient left: in bed;with call bell/phone within reach;with nursing/sitter in room;with bed alarm set  OT Visit Diagnosis: Unsteadiness on feet (R26.81);Other abnormalities of gait and mobility (R26.89);Muscle weakness (generalized) (M62.81);Other symptoms and signs involving cognitive function                Time: 1610-9604 OT Time Calculation (min): 16 min Charges:  OT General Charges $OT Visit: 1 Visit OT Evaluation $OT Eval Moderate Complexity: 1 Mod G-Codes:     Sherryl Manges OTR/L [phone]  Evern Bio Mya Suell 12/04/2017,  9:13 AM

## 2017-12-04 NOTE — Progress Notes (Signed)
EEG completed; results pending.    

## 2017-12-05 LAB — RENAL FUNCTION PANEL
ALBUMIN: 2.6 g/dL — AB (ref 3.5–5.0)
ANION GAP: 9 (ref 5–15)
BUN: 34 mg/dL — ABNORMAL HIGH (ref 6–20)
CHLORIDE: 101 mmol/L (ref 101–111)
CO2: 27 mmol/L (ref 22–32)
Calcium: 9.7 mg/dL (ref 8.9–10.3)
Creatinine, Ser: 1.44 mg/dL — ABNORMAL HIGH (ref 0.44–1.00)
GFR calc Af Amer: 47 mL/min — ABNORMAL LOW (ref >60)
GFR calc non Af Amer: 41 mL/min — ABNORMAL LOW (ref >60)
GLUCOSE: 105 mg/dL — AB (ref 65–99)
PHOSPHORUS: 3 mg/dL (ref 2.5–4.6)
Potassium: 3.1 mmol/L — ABNORMAL LOW (ref 3.5–5.1)
Sodium: 137 mmol/L (ref 135–145)

## 2017-12-05 LAB — THERAPEUTIC PLASMA EXCHANGE (BLOOD BANK)
Plasma Exchange: 2589
Plasma volume needed: 2583
UNIT DIVISION: 0
UNIT DIVISION: 0
UNIT DIVISION: 0
UNIT DIVISION: 0
UNIT DIVISION: 0
Unit division: 0
Unit division: 0
Unit division: 0
Unit division: 0
Unit division: 0

## 2017-12-05 LAB — POCT I-STAT, CHEM 8
BUN: 30 mg/dL — AB (ref 6–20)
CALCIUM ION: 1.23 mmol/L (ref 1.15–1.40)
CHLORIDE: 98 mmol/L — AB (ref 101–111)
CREATININE: 1.2 mg/dL — AB (ref 0.44–1.00)
GLUCOSE: 107 mg/dL — AB (ref 65–99)
HCT: 22 % — ABNORMAL LOW (ref 36.0–46.0)
Hemoglobin: 7.5 g/dL — ABNORMAL LOW (ref 12.0–15.0)
POTASSIUM: 3.1 mmol/L — AB (ref 3.5–5.1)
Sodium: 138 mmol/L (ref 135–145)
TCO2: 27 mmol/L (ref 22–32)

## 2017-12-05 LAB — ADAMTS13 ACTIVITY: Adamts 13 Activity: 61.3 % — ABNORMAL LOW (ref >66.8)

## 2017-12-05 LAB — ADAMTS13 ACTIVITY REFLEX

## 2017-12-05 LAB — HAPTOGLOBIN: Haptoglobin: <10 mg/dL — ABNORMAL LOW (ref 34–200)

## 2017-12-05 LAB — HEPATIC FUNCTION PANEL
ALK PHOS: 91 U/L (ref 38–126)
ALT: 31 U/L (ref 14–54)
AST: 67 U/L — AB (ref 15–41)
Albumin: 2.6 g/dL — ABNORMAL LOW (ref 3.5–5.0)
BILIRUBIN DIRECT: 0.7 mg/dL — AB (ref 0.1–0.5)
BILIRUBIN INDIRECT: 1.9 mg/dL — AB (ref 0.3–0.9)
BILIRUBIN TOTAL: 2.6 mg/dL — AB (ref 0.3–1.2)
Total Protein: 4.6 g/dL — ABNORMAL LOW (ref 6.5–8.1)

## 2017-12-05 LAB — CBC
HEMATOCRIT: 20.8 % — AB (ref 36.0–46.0)
Hemoglobin: 7.1 g/dL — ABNORMAL LOW (ref 12.0–15.0)
MCH: 34 pg (ref 26.0–34.0)
MCHC: 34.1 g/dL (ref 30.0–36.0)
MCV: 99.5 fL (ref 78.0–100.0)
Platelets: 24 10*3/uL — CL (ref 150–400)
RBC: 2.09 MIL/uL — ABNORMAL LOW (ref 3.87–5.11)
RDW: 16.5 % — AB (ref 11.5–15.5)
WBC: 3.2 10*3/uL — ABNORMAL LOW (ref 4.0–10.5)

## 2017-12-05 LAB — C3 COMPLEMENT: C3 Complement: 58 mg/dL — ABNORMAL LOW (ref 82–167)

## 2017-12-05 LAB — PREPARE RBC (CROSSMATCH)

## 2017-12-05 LAB — HEPATITIS B SURFACE ANTIGEN: Hepatitis B Surface Ag: NEGATIVE

## 2017-12-05 LAB — C4 COMPLEMENT: Complement C4, Body Fluid: 12 mg/dL — ABNORMAL LOW (ref 14–44)

## 2017-12-05 MED ORDER — LORAZEPAM 2 MG/ML IJ SOLN
INTRAMUSCULAR | Status: AC
  Filled 2017-12-05: qty 1

## 2017-12-05 MED ORDER — CALCIUM GLUCONATE 10 % IV SOLN
2.0000 g | Freq: Once | INTRAVENOUS | Status: DC
  Filled 2017-12-05: qty 20

## 2017-12-05 MED ORDER — CALCIUM CARBONATE ANTACID 500 MG PO CHEW
2.0000 | CHEWABLE_TABLET | ORAL | Status: DC
  Administered 2017-12-05: 400 mg via ORAL

## 2017-12-05 MED ORDER — ACD FORMULA A 0.73-2.45-2.2 GM/100ML VI SOLN
500.0000 mL | Status: DC
  Filled 2017-12-05: qty 500

## 2017-12-05 MED ORDER — CALCIUM CARBONATE ANTACID 500 MG PO CHEW: 2.0000 | CHEWABLE_TABLET | ORAL | Status: DC

## 2017-12-05 MED ORDER — SODIUM CHLORIDE 0.9 % IV SOLN: Freq: Once | INTRAVENOUS | Status: DC

## 2017-12-05 MED ORDER — ACETAMINOPHEN 325 MG PO TABS: 650.0000 mg | ORAL_TABLET | ORAL | Status: DC | PRN

## 2017-12-05 MED ORDER — CALCIUM CARBONATE ANTACID 500 MG PO CHEW
CHEWABLE_TABLET | ORAL | Status: AC
  Administered 2017-12-05: 400 mg via ORAL
  Filled 2017-12-05: qty 2

## 2017-12-05 MED ORDER — ANTICOAGULANT SODIUM CITRATE 4% (200MG/5ML) IV SOLN
5.0000 mL | Freq: Once | Status: DC
  Filled 2017-12-05: qty 250

## 2017-12-05 MED ORDER — HALOPERIDOL LACTATE 5 MG/ML IJ SOLN
2.0000 mg | Freq: Four times a day (QID) | INTRAMUSCULAR | Status: DC | PRN
  Administered 2017-12-06 – 2017-12-15 (×12): 2 mg via INTRAVENOUS
  Filled 2017-12-05 (×7): qty 1
  Filled 2017-12-05: qty 0.4
  Filled 2017-12-05 (×6): qty 1

## 2017-12-05 MED ORDER — SODIUM CHLORIDE 0.9 % IV SOLN
2.0000 g | Freq: Once | INTRAVENOUS | Status: AC
  Administered 2017-12-05: 2 g via INTRAVENOUS
  Filled 2017-12-05: qty 20

## 2017-12-05 MED ORDER — DIPHENHYDRAMINE HCL 25 MG PO CAPS: 25.0000 mg | ORAL_CAPSULE | Freq: Four times a day (QID) | ORAL | Status: DC | PRN

## 2017-12-05 MED ORDER — ANTICOAGULANT SODIUM CITRATE 4% (200MG/5ML) IV SOLN
5.0000 mL | Freq: Once | Status: AC
  Administered 2017-12-05: 5 mL
  Filled 2017-12-05: qty 5

## 2017-12-05 NOTE — Progress Notes (Signed)
Test to Dr Allena KatzPatel with change in patient's loc with hallucinations and confusion with vital signs 144/119, 119, rectal temp 99.1.

## 2017-12-05 NOTE — Progress Notes (Signed)
Admit: 12/03/2017 LOS: 2  44F admitted 1/2 to OSH with AKI (BL SCr 1.1?), progressive TCP, AMS, in setting of BPAD and ongoing active substance abuse  Subjective:  Renal function with significant improvement Rec TPE #3 today K 3.1 UOP 1.1L Agitated   01/04 0701 - 01/05 0700 In: 1123 [P.O.:370; I.V.:753] Out: 1150 [Urine:1150]  Filed Weights   12/03/17 1940 12/04/17 0403 12/05/17 0312  Weight: 70.5 kg (155 lb 6.8 oz) 71.6 kg (157 lb 13.6 oz) 71.3 kg (157 lb 3 oz)    Scheduled Meds: . LORazepam      . calcium carbonate  2 tablet Oral Q3H  . folic acid  1 mg Oral Daily  . lactulose  20 g Oral Daily  . levothyroxine  112 mcg Oral QAC breakfast  . mouth rinse  15 mL Mouth Rinse BID  . methylPREDNISolone (SOLU-MEDROL) injection  125 mg Intravenous Q6H  . multivitamin with minerals  1 tablet Oral Daily  . nicotine  14 mg Transdermal Daily  . sodium chloride flush  3 mL Intravenous Q12H  . thiamine  100 mg Oral Daily   Continuous Infusions: . anticoagulant sodium citrate    . calcium gluconate IVPB 2 g (12/05/17 0925)  . citrate dextrose     PRN Meds:.acetaminophen **OR** acetaminophen, acetaminophen, diphenhydrAMINE, LORazepam, ondansetron **OR** ondansetron (ZOFRAN) IV, polyethylene glycol  Current Labs: reviewed  ADAMTS 13 pending Results for Geoffery LyonsBOWEN, Suella C (MRN [id number]) as of 12/05/2017 10:24  Ref. Range 12/03/2017 19:26  C3 Complement Latest Ref Range: 82 - 167 mg/dL 58 (L)  Complement C4, Body Fluid Latest Ref Range: 14 - 44 mg/dL 12 (L)     Physical Exam:  Blood pressure (!) 131/111, pulse (!) 112, temperature 98.2 F (36.8 C), temperature source Oral, resp. rate 18, height 5\' 3"  (1.6 m), weight 71.3 kg (157 lb 3 oz), SpO2 98 %. GEN: awake and agitated. ENT: NCAT EYES: EOMI CV: RRR  PULM: CTAB ant auscultation ABD: s/nt/nd SKIN: scattered brusing throughout trunk / extremities EXT:No LEE  A 1. Oliguric AKI, OSH Renal US reported no obstruction/HN and foley  catheter placed; BL SCr at least 1.1.  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 MAHA / TCP; hematology following; very possible this is TTP and TPE orders written by hematology, started 1/3.  ? If could be related to quetiapine, did she inject narcotics/opana (reported in literature).  No diarrhea reported, BP is not elevated.  C3 and C4 and ADAMTS13 pending.  INR is mildly increased and fibrinogen depressed/Ddmier up.   3. AMS, probably related to #2, substance use issues 4. BPAD on seroquel and gabapentin as outpatient; both on hold here 5. Hypothyroidism 6. Polysubstance abuse; OSH + for amphetamines, barbituates, THC, cocaine  7. Tobacco use  P 1. Renal function has improved quickly, suggesting some pre-renal component, I wouldn't expect TMA issue to resolve this fast 2. She is back to her baseline GFR at this point 3. No further renal issues, and TCP  With ? Of TTP and TMA addressed by hematology 4. Will sign off for now, no outpt f/u needed at this time.  Call back with any questions or concerns.   Sabra Heckyan Rogan Wigley MD 12/05/2017, 10:23 AM  Recent Labs  Lab 12/03/17 1926  12/04/17 0500 12/05/17 0425 12/05/17 0906  NA 129*   < > 133* 137 138  K 3.8   < > 3.7 3.1* 3.1*  CL 101   < > 101 101 98*  CO2 17*  --  23 27  --   GLUCOSE 118*   < > 109* 105* 107*  BUN 48*   < > 44* 34* 30*  CREATININE 2.73*   < > 2.31* 1.44* 1.20*  CALCIUM 7.8*  --  8.2* 9.7  --   PHOS 4.8*  --  4.0 3.0  --    < > = values in this interval not displayed.   Recent Labs  Lab 12/03/17 1926  12/04/17 0500 12/05/17 0425 12/05/17 0906  WBC 6.5  --  4.0 3.2*  --   NEUTROABS 5.5  --   --   --   --   HGB 9.7*   < > 8.5* 7.1* 7.5*  HCT 28.6*   < > 25.3* 20.8* 22.0*  MCV 97.3  --  97.7 99.5  --   PLT 28*  28*  --  24* 24*  --    < > = values in this interval not displayed.

## 2017-12-05 NOTE — Progress Notes (Signed)
Claire Salinas   DOB:1965/12/31   ZO#:[id number]   JWJ#:[id number]  Hematology follow up note   Subjective: Patient has had three plasma exchange since admission, plt remains to be low at 24K this morning before third TPE today. She is still disoriented, and received ativan during TPE today for agitation.  When I saw her in the afternoon, she was calm, and was able to answer questions more appropriately. Her BP has been hight today. Afebrile   Objective:  Vitals:   12/05/17 1100 12/05/17 1145  BP: (!) 144/119   Pulse: (!) 118   Resp:    Temp:  99.2 F (37.3 C)  SpO2:      Body mass index is 27.84 kg/m.  Intake/Output Summary (Last 24 hours) at 12/05/2017 1641 Last data filed at 12/05/2017 6213 Gross per 24 hour  Intake 1110 ml  Output 500 ml  Net 610 ml     Sclerae unicteric  Oropharynx clear  No peripheral adenopathy  Lungs clear -- no rales or rhonchi  Heart regular rate and rhythm  Abdomen benign  MSK no focal spinal tenderness, (+) anasarca in all extremities   Neuro disoriented, does not follow commands, moves all extremities   CBG (last 3)  No results for input(s): GLUCAP in the last 72 hours.   Labs:  CBC Latest Ref Rng & Units 12/05/2017 12/05/2017 12/04/2017  WBC 4.0 - 10.5 K/uL - 3.2(L) 4.0  Hemoglobin 12.0 - 15.0 g/dL 7.5(L) 7.1(L) 8.5(L)  Hematocrit 36.0 - 46.0 % 22.0(L) 20.8(L) 25.3(L)  Platelets 150 - 400 K/uL - 24(LL) 24(LL)    CMP Latest Ref Rng & Units 12/05/2017 12/05/2017 12/04/2017  Glucose 65 - 99 mg/dL 086(V) 784(O) 962(X)  BUN 6 - 20 mg/dL 52(W) 41(L) 24(M)  Creatinine 0.44 - 1.00 mg/dL 0.10(U) 7.25(D) 6.64(Q)  Sodium 135 - 145 mmol/L 138 137 133(L)  Potassium 3.5 - 5.1 mmol/L 3.1(L) 3.1(L) 3.7  Chloride 101 - 111 mmol/L 98(L) 101 101  CO2 22 - 32 mmol/L - 27 23  Calcium 8.9 - 10.3 mg/dL - 9.7 0.3(K)  Total Protein 6.5 - 8.1 g/dL - 4.6(L) -  Total Bilirubin 0.3 - 1.2 mg/dL - 2.6(H) -  Alkaline Phos 38 - 126 U/L - 91 -  AST 15 - 41 U/L - 67(H) -  ALT  14 - 54 U/L - 31 -    Urine Studies No results for input(s): UHGB, CRYS in the last 72 hours.  Invalid input(s): UACOL, UAPR, USPG, UPH, UTP, UGL, UKET, UBIL, UNIT, UROB, [place], UEPI, UWBC, [place], [place], [place], [place], [place]  Basic Metabolic Panel: Recent Labs  Lab 12/03/17 1926 12/04/17 0011 12/04/17 0500 12/05/17 0425 12/05/17 0906  NA 129* 131* 133* 137 138  K 3.8 4.0 3.7 3.1* 3.1*  CL 101 103 101 101 98*  CO2 17*  --  23 27  --   GLUCOSE 118* 111* 109* 105* 107*  BUN 48* 40* 44* 34* 30*  CREATININE 2.73* 2.70* 2.31* 1.44* 1.20*  CALCIUM 7.8*  --  8.2* 9.7  --   MG 1.6*  --  2.0  --   --   PHOS 4.8*  --  4.0 3.0  --    GFR Estimated Creatinine Clearance: 51.9 mL/min (A) (by C-G formula based on SCr of 1.2 mg/dL (H)). Liver Function Tests: Recent Labs  Lab 12/03/17 1926 12/04/17 0500 12/05/17 0425  AST 161*  --  67*  ALT 60*  --  31  ALKPHOS 78  --  91  BILITOT 3.3*  --  2.6*  PROT 3.8*  --  4.6*  ALBUMIN 2.1* 2.2* 2.6*  2.6*   No results for input(s): LIPASE, AMYLASE in the last 168 hours. No results for input(s): AMMONIA in the last 168 hours. Coagulation profile Recent Labs  Lab 12/03/17 1926 12/04/17 0500  INR 1.58 1.14    CBC: Recent Labs  Lab 12/03/17 1926 12/04/17 0011 12/04/17 0500 12/05/17 0425 12/05/17 0906  WBC 6.5  --  4.0 3.2*  --   NEUTROABS 5.5  --   --   --   --   HGB 9.7* 9.9* 8.5* 7.1* 7.5*  HCT 28.6* 29.0* 25.3* 20.8* 22.0*  MCV 97.3  --  97.7 99.5  --   PLT 28*  28*  --  24* 24*  --    Cardiac Enzymes: Recent Labs  Lab 12/03/17 1926  CKTOTAL 686*   BNP: Invalid input(s): POCBNP CBG: No results for input(s): GLUCAP in the last 168 hours. D-Dimer Recent Labs    12/03/17 1926  DDIMER 3.38*   Hgb A1c No results for input(s): HGBA1C in the last 72 hours. Lipid Profile No results for input(s): CHOL, HDL, LDLCALC, TRIG, CHOLHDL, LDLDIRECT in the last 72 hours. Thyroid function studies Recent Labs    12/03/17 1830   TSH 7.108*   Anemia work up Recent Labs    12/03/17 1830 12/03/17 1926  VITAMINB12  --  413  FOLATE 3.6*  --   RETICCTPCT  --  1.6   Microbiology Recent Results (from the past 240 hour(s))  MRSA PCR Screening     Status: None   Collection Time: 12/03/17  5:33 PM  Result Value Ref Range Status   MRSA by PCR NEGATIVE NEGATIVE Final    Comment:        The GeneXpert MRSA Assay (FDA approved for NASAL specimens only), is one component of a comprehensive MRSA colonization surveillance program. It is not intended to diagnose MRSA infection nor to guide or monitor treatment for MRSA infections.   Culture, Urine     Status: None   Collection Time: 12/03/17  6:15 PM  Result Value Ref Range Status   Specimen Description URINE, RANDOM  Final   Special Requests NONE  Final   Culture NO GROWTH  Final   Report Status 12/04/2017 FINAL  Final  Culture, blood (routine x 2)     Status: None (Preliminary result)   Collection Time: 12/03/17  9:03 PM  Result Value Ref Range Status   Specimen Description BLOOD LEFT ANTECUBITAL  Final   Special Requests IN PEDIATRIC BOTTLE Blood Culture adequate volume  Final   Culture NO GROWTH 2 DAYS  Final   Report Status PENDING  Incomplete  Culture, blood (routine x 2)     Status: None (Preliminary result)   Collection Time: 12/03/17  9:03 PM  Result Value Ref Range Status   Specimen Description BLOOD RIGHT HAND  Final   Special Requests IN PEDIATRIC BOTTLE Blood Culture adequate volume  Final   Culture NO GROWTH 2 DAYS  Final   Report Status PENDING  Incomplete    Adamts 13 Activity >66.8 % 61.3 Abnormally low       Studies:  Ct Head Wo Contrast  Result Date: 12/03/2017 CLINICAL DATA:  Initial evaluation for acute altered mental status. EXAM: CT HEAD WITHOUT CONTRAST TECHNIQUE: Contiguous axial images were obtained from the base of the skull through the vertex without intravenous contrast. COMPARISON:  None available. FINDINGS:  Brain:  Cerebral volume within normal limits for patient age. Mild chronic small vessel ischemic changes within the cerebral white matter. No evidence for acute intracranial hemorrhage. No findings to suggest acute large vessel territory infarct. No mass lesion, midline shift, or mass effect. Ventricles are normal in size without evidence for hydrocephalus. No extra-axial fluid collection identified. Vascular: No hyperdense vessel identified. Skull: Scalp soft tissues demonstrate no acute abnormality.Calvarium intact. Soft tissue calcification noted at the left frontal scalp near the vertex, of doubtful significance. Sinuses/Orbits: Globes and orbital soft tissues are within normal limits. Visualized paranasal sinuses are clear. No mastoid effusion. IMPRESSION: 1. No acute intracranial abnormality. 2. Mild chronic microvascular ischemic disease. Electronically Signed   By: Rise Mu M.D.   On: 12/03/2017 23:22   Mr Brain Wo Contrast  Result Date: 12/04/2017 CLINICAL DATA:  52 y/o F; generalized weakness, TTP, acute kidney injury, acute encephalopathy. EXAM: MRI HEAD WITHOUT CONTRAST TECHNIQUE: Multiplanar, multiecho pulse sequences of the brain and surrounding structures were obtained without intravenous contrast. COMPARISON:  12/03/2017 CT head. FINDINGS: Brain: No acute infarction, hemorrhage, hydrocephalus, extra-axial collection or mass lesion. Few nonspecific foci of T2 FLAIR hyperintense signal abnormality in subcortical and periventricular white matter are compatible with mild chronic microvascular ischemic changes for age. Mild brain parenchymal volume loss. Vascular: Normal flow voids. Skull and upper cervical spine: Normal marrow signal. Sinuses/Orbits: Negative. Other: None. IMPRESSION: 1. No acute intracranial abnormality identified. 2. Mild chronic microvascular ischemic changes and mild parenchymal volume loss of the brain. Electronically Signed   By: Mitzi Hansen M.D.   On:  12/04/2017 22:03   Dg Chest Port 1 View  Result Date: 12/03/2017 CLINICAL DATA:  Shortness of breath EXAM: PORTABLE CHEST 1 VIEW COMPARISON:  None. FINDINGS: A right central venous catheter is in place with tip projected over the right clavicular head. The catheter is somewhat laterally positioned but this is likely due to patient rotation. Tip is probably in the upper SVC region. Prominent right paratracheal soft tissues. This is likely vascular but lymphadenopathy is a less likely consideration. No pneumothorax. No blunting of costophrenic angles. No focal consolidation. Linear atelectasis or fibrosis in the left lung base. Heart size and pulmonary vascularity are normal. IMPRESSION: Right central venous catheter tip is in the upper SVC region. Prominent right paratracheal soft tissues are likely vascular. Lymphadenopathy is a less likely consideration. Linear atelectasis or fibrosis in the left lung base. Electronically Signed   By: Burman Nieves M.D.   On: 12/03/2017 20:58   Dg Chest Port 1v Same Day  Result Date: 12/03/2017 CLINICAL DATA:  Central line insertion.  Site infection. EXAM: PORTABLE CHEST 1 VIEW COMPARISON:  12/03/2017 FINDINGS: Since the previous study, there is interval placement of a left central venous catheter with tip over the low SVC region. No pneumothorax. An indwelling right central venous catheter is unchanged in position with tip over the upper SVC region. Shallow inspiration with linear atelectasis in the lung bases. Heart size and pulmonary vascularity are normal. Prominent right paratracheal soft tissue is likely vascular and may be related to patient rotation. IMPRESSION: Left central venous catheter with tip over the low SVC region. Right central venous catheter with tip over the upper SVC region. Shallow inspiration with atelectasis in lung bases. Electronically Signed   By: Burman Nieves M.D.   On: 12/03/2017 22:52    Assessment: 52 y.o.  female, with past medical  history of bipolar, polysubstance abuse, hypothyroidism, DVT, was transferred from outside hospital for  presumed TTP.  1. Severe thrombocytopenia, secondary to bactrim vs polysubstance abuse, mild DIC, unlikely TTP  2. Acute metabolic encephalopathy, multifactorial, including TTP, AKI, polysubstance abuse, elevated amonia, and hypothyroidism, improving  3.  AKI, improving  4.  Polysubstance abuse, including alcohol, cocaine, marijuana, methadone 5. Hyponatremia, resolved  6.  Transaminitis, possibly related to alcohol and substance abuse 7. Coagulapathy (low fibrinogen, mildly elevated PT), DIC vs liver etiology  8.  Transaminitis and elevated amonia, likely secondary to alcohol and polysubstance abuse 9. Hypertension  10, worsening anemia    Recommendations: -Her ADMTS13 came back today which was 61.3%, this is close to the normal range, so it will basically rule out TTP.  And clinically her thrombocytopenia has not responded to plasma exchange -will stop plasma exchange -I think her underlying thrombocytopenia is likely related to Bactrim and or polysubstance abuse, she also has mild DIC on lab, which will probably contribute to it -She was probably quite dehydrated when she came in, with borderline hypotension, AK I, and her AK has resolved after IV fluids she is now hypertensive. She may also have had chronic HTN and possible episode of hypertensive crisis from her substance abuse -at this point, we will continue monitor her CBC daily.  She has had a worsening anemia, probably related to IV fluids dilution, plasma exchange etc, I recommend 2u RBC blood transfusion  -She is not actively bleeding, continue to watch CBC, no need for platelet transfusion unless she has active bleeding or platelet counts less than 10K -will stop her steroids also  -will continue to f/u  -I spoke with Dr. Allena Katz today    Malachy Mood, MD 12/05/2017

## 2017-12-05 NOTE — Progress Notes (Signed)
Pt restless and stating her legs hurt; administered Ativan 2mg  IV and pt became calm and stated the leg pain is getting better. Tx completed without any complication restlessness resolved; pt transported back to her room after report was given to Berkshire HathawayMitzi Brown, Charity fundraiserN.

## 2017-12-05 NOTE — Progress Notes (Signed)
[facility] Progress Note  Patient: Claire LyonsKimberly C Leete WUJ:[id number]   PCP: Kela MillinBarrino, Alethea Y, MD DOB: 06/19/1966   DOA: 12/03/2017   DOS: 12/05/2017   Date of Service: the patient was seen and examined on 12/05/2017  Subjective: More awake, no acute complaints.  Still confused.  Brief hospital course: Pt. 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 Currently further plan is continue current care.  Assessment and Plan: 1. AKI (acute kidney injury) (HCC) improving With oliguri on presentation now resolved. Patient presented from outside hospital with Foley catheter in place. Continue strict in and out. No obstructive uropathy on ultrasound identified. UA here shows bland urine Nephrology consulted, appreciate input.  Now signed off. Plasmapheresis started. No indication for HD right now.  On LR. Most likely combination of prerenal as well as renal etiology. Mild elevation of CK Patient may have hypertension emergency as well since she has been positive for cocaine at home which might have caused renal insufficiency.  2.    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 Drug induced thrombocytopenia.  [facility] because for concern for TTP HUS-like syndrome. Hematology has been consulted.  Highly appreciate their input. Low fibrinogen, elevated INR, elevated d-dimer, worsening thrombocytopenia as well as schistocytes on the smear with acute encephalopathy consistent with the diagnosis of TTP.   ADMTS 13 came back today 61.3 which essentially rules out TTP. Started on plasmapheresis, completed 3 cycles.  Currently based on above result further plasmapheresis are discontinued. Drug-induced possibility due to patient being on Bactrim as well as multiple substance  abuse. Hypertension from cocaine can be also possibility. Blood culture negative for 2 days ruling out infection. Currently steroids are also stopped.  3.  Acute metabolic encephalopathy. Etiology not clear, probably multifactorial. TTP-HUS, acute metabolic encephalopathy from acute kidney injury, delirium tremens from alcohol withdrawal, substance abuse, hypothyroidism with noncompliance with Synthroid.  All these are actively presenting this patient. CT head unremarkable for any acute abnormality. Mild elevation of ammonia, continue daily lactulose. Unremarkable MRI brain and EEG shows metabolic encephalopathy. PT OT consulted.  Recommends SNF Avoid psychotropic medications unless indicated. Folic acid low, replacing, thiamine replaced, B12 replaced. Patient shows some agitation and hallucination post HD today.  Haldol added.  If patient does not show any improvement tomorrow may require neurology psychiatry consultation.   4.  Hypothyroidism. Noncompliance with Synthroid. TSH was 8.18 at the facility. recheck TSH 7.1 and free T4 0.88. Continue Synthroid dose. Cortisol level 60 appropriate for stress.  5.  Alcohol abuse. Drinks 8 glasses of vodka on daily basis. CIWA protocol.  Monitor for DT  6.  Polysubstance abuse. Monitor for now. CIWA protocol.  7.  Multiple bruises. Patient denies any injury or assault. Likely from thrombocytopenia as well as ataxia. Monitor. Would need to rediscuss regarding harm at home once patient's mentation is clear. Social worker consulted.  8.  Active smoker. Nicotine patch for now. Unable to discuss quitting and counseling due to mental status changes.  9.  Elevated LFT. Fatty liver. AST ALT elevation consistent with alcoholic liver disease. Bilirubin elevated with indirect bilirubin primarily elevated consistent with hemolysis. Mild elevation of LDH as well. Recheck the labs. Hep B and hep C. Avoid hepatotoxic medication,  Tylenol less than 2 g okay.  10.  Central line. Presenting from outside facility with a central line present in  right IJ. Chest x-ray shows tip in upper SVC region.  Good blood draw.  Use as a peripheral IV but do not use for pressors.  11.  Suspected UTI. Patient was treated with some IV antibiotics in the outside facility I do not have any information available regarding this on MAR. Currently will monitor clinically.  12.  Acute anemia. Hemoglobin is worsening most likely dilutional with hydration. Hematology recommends 2 PRBC transfusion, ordered.  Diet: Renal diet, aspiration precaution DVT Prophylaxis: mechanical compression device  Advance goals of care discussion: full code, step mother who has adopted the patient, is the primary decision maker for the patient.  Discussed with patient's legal sister(aunt) at bedside.  They wanted to consider DNR/DNI for the patient, I recommended to monitor for next few days allowing plasmapheresis to work on TTP.  Sister will talk with mother and inform us regarding their final decision  Family Communication: no family was present at bedside, at the time of interview.  This with patient's sister on 12/04/2017.  Disposition:  Discharge to [facility]  Consultants: Nephrology, hematology, CCM Procedures: HD catheter placement, plasmapheresis, EEG  Antibiotics: Anti-infectives (From admission, onward)   None       Objective: Physical Exam: Vitals:   12/05/17 1600 12/05/17 1708 12/05/17 1800 12/05/17 1815  BP: (!) 150/93 (!) 156/101 (!) 161/104   Pulse: (!) 110  (!) 113   Resp:      Temp:  98.5 F (36.9 C)  98.3 F (36.8 C)  TempSrc:  Oral  Oral  SpO2:      Weight:      Height:        Intake/Output Summary (Last 24 hours) at 12/05/2017 1829 Last data filed at 12/05/2017 1610 Gross per 24 hour  Intake 1110 ml  Output 500 ml  Net 610 ml   Filed Weights   12/03/17 1940 12/04/17 0403 12/05/17 0312  Weight: 70.5 kg (155 lb 6.8 oz) 71.6  kg (157 lb 13.6 oz) 71.3 kg (157 lb 3 oz)   General: Alert, Awake and Oriented to Person. Appear in moderate distress, affect blunted Eyes: PERRL, Conjunctiva normal ENT: Oral Mucosa clear dry. Neck: difficult to assess JVD, no Abnormal Mass Or lumps Cardiovascular: S1 and S2 Present, aortic systolic Murmur, Peripheral Pulses Present Respiratory: normal respiratory effort, Bilateral Air entry equal and Decreased, no use of accessory muscle, Clear to Auscultation, no Crackles, no wheezes Abdomen: Bowel Sound present, Soft and no tenderness, no hernia Skin: no redness, no Rash, no induration Extremities: trace Pedal edema, no calf tenderness Neurologic:  mental status AAOx3, speech normal, attention abnormal, needs multiple redirection as well as keeps on repeating same and again and again. Cranial Nerves PERRL, EOM normal and present, Motor strength bilateral equal, 3 out of 5. Sensation present to painful stimuli,  Reflexes difficult to elicit knee and biceps, babinski difficult to elicit,  Cerebellar test difficult to elicit. Gait not checked due to patient safety concerns.  Data Reviewed: CBC: Recent Labs  Lab 12/03/17 1926 12/04/17 0011 12/04/17 0500 12/05/17 0425 12/05/17 0906  WBC 6.5  --  4.0 3.2*  --   NEUTROABS 5.5  --   --   --   --   HGB 9.7* 9.9* 8.5* 7.1* 7.5*  HCT 28.6* 29.0* 25.3* 20.8* 22.0*  MCV 97.3  --  97.7 99.5  --   PLT 28*  28*  --  24* 24*  --    Basic Metabolic Panel: Recent Labs  Lab 12/03/17 1926 12/04/17  0011 12/04/17 0500 12/05/17 0425 12/05/17 0906  NA 129* 131* 133* 137 138  K 3.8 4.0 3.7 3.1* 3.1*  CL 101 103 101 101 98*  CO2 17*  --  23 27  --   GLUCOSE 118* 111* 109* 105* 107*  BUN 48* 40* 44* 34* 30*  CREATININE 2.73* 2.70* 2.31* 1.44* 1.20*  CALCIUM 7.8*  --  8.2* 9.7  --   MG 1.6*  --  2.0  --   --   PHOS 4.8*  --  4.0 3.0  --     Liver Function Tests: Recent Labs  Lab 12/03/17 1926 12/04/17 0500 12/05/17 0425  AST 161*   --  67*  ALT 60*  --  31  ALKPHOS 78  --  91  BILITOT 3.3*  --  2.6*  PROT 3.8*  --  4.6*  ALBUMIN 2.1* 2.2* 2.6*  2.6*   No results for input(s): LIPASE, AMYLASE in the last 168 hours. No results for input(s): AMMONIA in the last 168 hours. Coagulation Profile: Recent Labs  Lab 12/03/17 1926 12/04/17 0500  INR 1.58 1.14   Cardiac Enzymes: Recent Labs  Lab 12/03/17 1926  CKTOTAL 686*   BNP (last 3 results) No results for input(s): PROBNP in the last 8760 hours. CBG: No results for input(s): GLUCAP in the last 168 hours. Studies: Mr Brain 33 Contrast  Result Date: 12/04/2017 CLINICAL DATA:  52 y/o F; generalized weakness, TTP, acute kidney injury, acute encephalopathy. EXAM: MRI HEAD WITHOUT CONTRAST TECHNIQUE: Multiplanar, multiecho pulse sequences of the brain and surrounding structures were obtained without intravenous contrast. COMPARISON:  12/03/2017 CT head. FINDINGS: Brain: No acute infarction, hemorrhage, hydrocephalus, extra-axial collection or mass lesion. Few nonspecific foci of T2 FLAIR hyperintense signal abnormality in subcortical and periventricular white matter are compatible with mild chronic microvascular ischemic changes for age. Mild brain parenchymal volume loss. Vascular: Normal flow voids. Skull and upper cervical spine: Normal marrow signal. Sinuses/Orbits: Negative. Other: None. IMPRESSION: 1. No acute intracranial abnormality identified. 2. Mild chronic microvascular ischemic changes and mild parenchymal volume loss of the brain. Electronically Signed   By: Mitzi Hansen M.D.   On: 12/04/2017 22:03    Scheduled Meds: . folic acid  1 mg Oral Daily  . lactulose  20 g Oral Daily  . levothyroxine  112 mcg Oral QAC breakfast  . mouth rinse  15 mL Mouth Rinse BID  . multivitamin with minerals  1 tablet Oral Daily  . nicotine  14 mg Transdermal Daily  . sodium chloride flush  3 mL Intravenous Q12H  . thiamine  100 mg Oral Daily   Continuous  Infusions: . sodium chloride     PRN Meds: acetaminophen **OR** acetaminophen, haloperidol lactate, LORazepam, ondansetron **OR** ondansetron (ZOFRAN) IV, polyethylene glycol  Time spent: The patient is critically ill with multiple organ systems failure and requires high complexity decision making for assessment and support, frequent evaluation and titration of therapies. Critical Care Time devoted to patient care services described in this note is 35 minutes   Author: Lynden Oxford, MD [facility] Pager: [phone] 12/05/2017 6:29 PM  If 7PM-7AM, please contact night-coverage at [email or web address], password [facility]

## 2017-12-05 NOTE — Progress Notes (Signed)
Patient is now receiving 1st Unit Of Blood,  Constantly talking about things inappropriately and hearing other people talking to her pleasantly.  Patient is unable to take po meds swallow them.  Patient now shouting out numbers randomly. Continue to reorient patient to the hospital and time.

## 2017-12-06 DIAGNOSIS — F1210 Cannabis abuse, uncomplicated: Secondary | ICD-10-CM

## 2017-12-06 DIAGNOSIS — F17210 Nicotine dependence, cigarettes, uncomplicated: Secondary | ICD-10-CM

## 2017-12-06 DIAGNOSIS — D696 Thrombocytopenia, unspecified: Secondary | ICD-10-CM

## 2017-12-06 DIAGNOSIS — F1410 Cocaine abuse, uncomplicated: Secondary | ICD-10-CM

## 2017-12-06 DIAGNOSIS — R531 Weakness: Secondary | ICD-10-CM

## 2017-12-06 DIAGNOSIS — R251 Tremor, unspecified: Secondary | ICD-10-CM

## 2017-12-06 DIAGNOSIS — R410 Disorientation, unspecified: Secondary | ICD-10-CM

## 2017-12-06 DIAGNOSIS — F1920 Other psychoactive substance dependence, uncomplicated: Secondary | ICD-10-CM

## 2017-12-06 LAB — TYPE AND SCREEN
ABO/RH(D): A NEG
Antibody Screen: NEGATIVE
UNIT DIVISION: 0
Unit division: 0

## 2017-12-06 LAB — THERAPEUTIC PLASMA EXCHANGE (BLOOD BANK)
Plasma Exchange: 2583
Plasma volume needed: 2583
Plasma volume needed: 2583
UNIT DIVISION: 0
UNIT DIVISION: 0
UNIT DIVISION: 0
UNIT DIVISION: 0
UNIT DIVISION: 0
UNIT DIVISION: 0
UNIT DIVISION: 0
UNIT DIVISION: 0
UNIT DIVISION: 0
UNIT DIVISION: 0
Unit division: 0
Unit division: 0
Unit division: 0
Unit division: 0
Unit division: 0
Unit division: 0
Unit division: 0
Unit division: 0

## 2017-12-06 LAB — IRON AND TIBC
IRON: 195 ug/dL — AB (ref 28–170)
Saturation Ratios: 87 % — ABNORMAL HIGH (ref 10.4–31.8)
TIBC: 225 ug/dL — AB (ref 250–450)
UIBC: 30 ug/dL

## 2017-12-06 LAB — BPAM RBC
BLOOD PRODUCT EXPIRATION DATE: 201901162359
Blood Product Expiration Date: 201901162359
ISSUE DATE / TIME: 201901051815
ISSUE DATE / TIME: 201901052230
Unit Type and Rh: 600
Unit Type and Rh: 600

## 2017-12-06 LAB — RETICULOCYTES
RBC.: 2.76 MIL/uL — AB (ref 3.87–5.11)
RETIC CT PCT: 3 % (ref 0.4–3.1)
Retic Count, Absolute: 82.8 10*3/uL (ref 19.0–186.0)

## 2017-12-06 LAB — CBC
HCT: 25.6 % — ABNORMAL LOW (ref 36.0–46.0)
HEMOGLOBIN: 8.5 g/dL — AB (ref 12.0–15.0)
MCH: 30.8 pg (ref 26.0–34.0)
MCHC: 33.2 g/dL (ref 30.0–36.0)
MCV: 92.8 fL (ref 78.0–100.0)
Platelets: 14 10*3/uL — CL (ref 150–400)
RBC: 2.76 MIL/uL — ABNORMAL LOW (ref 3.87–5.11)
RDW: 19.7 % — AB (ref 11.5–15.5)
WBC: 6.6 10*3/uL (ref 4.0–10.5)

## 2017-12-06 LAB — FIBRINOGEN: Fibrinogen: 140 mg/dL — ABNORMAL LOW (ref 210–475)

## 2017-12-06 LAB — FERRITIN: FERRITIN: 395 ng/mL — AB (ref 11–307)

## 2017-12-06 MED ORDER — GABAPENTIN 400 MG PO CAPS
400.0000 mg | ORAL_CAPSULE | Freq: Once | ORAL | Status: AC
  Administered 2017-12-06: 400 mg via ORAL
  Filled 2017-12-06: qty 1

## 2017-12-06 MED ORDER — CLONIDINE HCL 0.1 MG/24HR TD PTWK
0.1000 mg | MEDICATED_PATCH | TRANSDERMAL | Status: DC
  Administered 2017-12-06 – 2017-12-13 (×2): 0.1 mg via TRANSDERMAL
  Filled 2017-12-06 (×2): qty 1

## 2017-12-06 MED ORDER — CALCIUM CARBONATE ANTACID 500 MG PO CHEW
CHEWABLE_TABLET | ORAL | Status: AC
  Filled 2017-12-06: qty 2

## 2017-12-06 MED ORDER — ACD FORMULA A 0.73-2.45-2.2 GM/100ML VI SOLN
Status: AC
  Filled 2017-12-06: qty 500

## 2017-12-06 MED ORDER — AMLODIPINE BESYLATE 5 MG PO TABS
5.0000 mg | ORAL_TABLET | Freq: Every day | ORAL | Status: DC
  Administered 2017-12-07 – 2017-12-15 (×8): 5 mg via ORAL
  Filled 2017-12-06 (×9): qty 1

## 2017-12-06 MED ORDER — HYDRALAZINE HCL 20 MG/ML IJ SOLN
10.0000 mg | INTRAMUSCULAR | Status: DC | PRN
  Administered 2017-12-06 – 2017-12-08 (×6): 10 mg via INTRAVENOUS
  Filled 2017-12-06 (×6): qty 1

## 2017-12-06 NOTE — Consult Note (Signed)
[place] [facility] Consult   Reason for Consult:  Agitation, confusion , drug abuse Referring Physician:  Dr. [name] Patient Identification: Claire Salinas MRN:  [id number] Principal Diagnosis: Polysubstance (including opioids) dependence with physiol dependence ([place]) Diagnosis:   Patient Active Problem List   Diagnosis Date Noted  . Polysubstance (including opioids) dependence with physiol dependence ([place]) [F19.20] 12/06/2017  . 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 SOB (shortness of breath) [R06.02]   . Central line insertion site infection, initial encounter [T80.212A]   . AKI (acute kidney injury) ([place]) [N17.9] 12/03/2017  . Acute metabolic encephalopathy [H85.27] 12/03/2017  . Polysubstance abuse ([place]) [F19.10] 12/03/2017  . Hypothyroidism [E03.9] 12/03/2017  . Thrombocytopenia ([place]) [D69.6] 12/03/2017  . Abnormal INR [R79.1] 12/03/2017  . LFT elevation [R94.5] 12/03/2017  . Alcohol abuse [F10.10] 12/03/2017  . Smoker [F17.200] 12/03/2017    Total Time spent with patient: 1 hour  Subjective:   Claire Salinas is a 52 y.o. female patient admitted with confusion and weakness  HPI: Patient who is a poor historian and still confused, disoriented, drowsy but arousable. It is difficulty to obtain history from the patient but she managed to reports history of Bipolar disorder and Polysubstance abuse. She was not able to remember why she was brought to the hospital. But hospitalist reports that patient was found to have TTP, acute kidney injury, thrombocytopenia and acute encephalopathy. However, they were unable to determine the cause of low platelet. Urine toxicology revealed positive Amphetamine, THC, Cocaine and Methadone. Staff nurse reports that patient has been getting confused, agitated and combative.    Past Psychiatric History: as above  Risk to Self:   Risk to Others:   Prior Inpatient Therapy:   Prior Outpatient Therapy:    Past Medical  History:  Past Medical History:  Diagnosis Date  . Bipolar disorder ([place])   . DVT (deep venous thrombosis) ([place])   . Hypothyroidism     Past Surgical History:  Procedure Laterality Date  . TONSILLECTOMY     Family History: History reviewed. No pertinent family history. Family Psychiatric  History:  Social History:  Social History   Substance and Sexual Activity  Alcohol Use Yes  . Alcohol/week: 33.6 oz  . Types: 28 Shots of liquor per week   Comment: Vodka     Social History   Substance and Sexual Activity  Drug Use Yes  . Types: Cocaine, Marijuana, Methamphetamines, Amphetamines    Social History   Socioeconomic History  . Marital status: Single    Spouse name: None  . Number of children: None  . Years of education: None  . Highest education level: None  Social Needs  . Financial resource strain: None  . Food insecurity - worry: None  . Food insecurity - inability: None  . Transportation needs - medical: None  . Transportation needs - non-medical: None  Occupational History  . None  Tobacco Use  . Smoking status: Current Every Day Smoker    Packs/day: 1.00    Types: Cigarettes  . Smokeless tobacco: Current User  Substance and Sexual Activity  . Alcohol use: Yes    Alcohol/week: 33.6 oz    Types: 56 Shots of liquor per week    Comment: Vodka  . Drug use: Yes    Types: Cocaine, Marijuana, Methamphetamines, Amphetamines  . Sexual activity: Yes  Other Topics Concern  . None  Social History Narrative  . None   Additional Social History:    Allergies:  No  Known Allergies  Labs:  Results for orders placed or performed during the hospital encounter of 12/03/17 (from the past 48 hour(s))  CBC     Status: Abnormal   Collection Time: 12/05/17  4:25 AM  Result Value Ref Range   WBC 3.2 (L) 4.0 - 10.5 K/uL   RBC 2.09 (L) 3.87 - 5.11 MIL/uL   Hemoglobin 7.1 (L) 12.0 - 15.0 g/dL   HCT 20.8 (L) 36.0 - 46.0 %   MCV 99.5 78.0 - 100.0 fL   MCH 34.0 26.0 -  34.0 pg   MCHC 34.1 30.0 - 36.0 g/dL   RDW 16.5 (H) 11.5 - 15.5 %   Platelets 24 (LL) 150 - 400 K/uL    Comment: REPEATED TO VERIFY CRITICAL VALUE NOTED.  VALUE IS CONSISTENT WITH PREVIOUSLY REPORTED AND CALLED VALUE.   Renal function panel     Status: Abnormal   Collection Time: 12/05/17  4:25 AM  Result Value Ref Range   Sodium 137 135 - 145 mmol/L   Potassium 3.1 (L) 3.5 - 5.1 mmol/L   Chloride 101 101 - 111 mmol/L   CO2 27 22 - 32 mmol/L   Glucose, Bld 105 (H) 65 - 99 mg/dL   BUN 34 (H) 6 - 20 mg/dL   Creatinine, Ser 1.44 (H) 0.44 - 1.00 mg/dL   Calcium 9.7 8.9 - 10.3 mg/dL   Phosphorus 3.0 2.5 - 4.6 mg/dL   Albumin 2.6 (L) 3.5 - 5.0 g/dL   GFR calc non Af Amer 41 (L) >60 mL/min   GFR calc Af Amer 47 (L) >60 mL/min    Comment: (NOTE) The eGFR has been calculated using the CKD EPI equation. This calculation has not been validated in all clinical situations. eGFR's persistently <60 mL/min signify possible Chronic Kidney Disease.    Anion gap 9 5 - 15  Hepatic function panel     Status: Abnormal   Collection Time: 12/05/17  4:25 AM  Result Value Ref Range   Total Protein 4.6 (L) 6.5 - 8.1 g/dL   Albumin 2.6 (L) 3.5 - 5.0 g/dL   AST 67 (H) 15 - 41 U/L   ALT 31 14 - 54 U/L   Alkaline Phosphatase 91 38 - 126 U/L   Total Bilirubin 2.6 (H) 0.3 - 1.2 mg/dL   Bilirubin, Direct 0.7 (H) 0.1 - 0.5 mg/dL   Indirect Bilirubin 1.9 (H) 0.3 - 0.9 mg/dL  Therapeutic plasma exchange (blood bank)     Status: None   Collection Time: 12/05/17  6:13 AM  Result Value Ref Range   Plasma Exchange 2583 mL ORDERED, 253m THAWED    Plasma volume needed 2,583    Unit Number [id number]   Blood Component Type THAWED PLASMA    Unit division 00    Status of Unit ISSUED,FINAL    Transfusion Status OK TO TRANSFUSE    Unit Number [id number]   Blood Component Type THAWED PLASMA    Unit division 00    Status of Unit ISSUED,FINAL    Transfusion Status OK TO TRANSFUSE    Unit Number  [id number]   Blood Component Type THAWED PLASMA    Unit division 00    Status of Unit ISSUED,FINAL    Transfusion Status OK TO TRANSFUSE    Unit Number [id number]   Blood Component Type THAWED PLASMA    Unit division 00    Status of Unit ISSUED,FINAL    Transfusion Status OK TO TRANSFUSE  Unit Number [id number]    Blood Component Type THAWED PLASMA    Unit division 00    Status of Unit ISSUED,FINAL    Transfusion Status OK TO TRANSFUSE    Unit Number [id number]    Blood Component Type THAWED PLASMA    Unit division 00    Status of Unit ISSUED,FINAL    Transfusion Status OK TO TRANSFUSE    Unit Number [id number]    Blood Component Type THAWED PLASMA    Unit division 00    Status of Unit ISSUED,FINAL    Transfusion Status OK TO TRANSFUSE    Unit Number [id number]    Blood Component Type THAWED PLASMA    Unit division 00    Status of Unit ISSUED,FINAL    Transfusion Status OK TO TRANSFUSE    Unit Number [id number]    Blood Component Type THAWED PLASMA    Unit division 00    Status of Unit ISSUED,FINAL    Transfusion Status OK TO TRANSFUSE   I-STAT, chem 8     Status: Abnormal   Collection Time: 12/05/17  9:06 AM  Result Value Ref Range   Sodium 138 135 - 145 mmol/L   Potassium 3.1 (L) 3.5 - 5.1 mmol/L   Chloride 98 (L) 101 - 111 mmol/L   BUN 30 (H) 6 - 20 mg/dL   Creatinine, Ser 1.20 (H) 0.44 - 1.00 mg/dL   Glucose, Bld 107 (H) 65 - 99 mg/dL   Calcium, Ion 1.23 1.15 - 1.40 mmol/L   TCO2 27 22 - 32 mmol/L   Hemoglobin 7.5 (L) 12.0 - 15.0 g/dL   HCT 22.0 (L) 36.0 - 46.0 %  Prepare RBC     Status: None   Collection Time: 12/05/17  5:10 PM  Result Value Ref Range   Order Confirmation ORDER PROCESSED BY BLOOD BANK   Therapeutic plasma exchange (blood bank)     Status: None   Collection Time: 12/06/17  1:51 AM  Result Value Ref Range   Plasma Exchange 2583 MLS ORDERED, 2583 MLS THAWED    Plasma volume needed 2,583    Unit Number [id number]     Blood Component Type THAWED PLASMA    Unit division 00    Status of Unit REL FROM [facility]    Transfusion Status OK TO TRANSFUSE    Unit Number [id number]    Blood Component Type THAWED PLASMA    Unit division 00    Status of Unit REL FROM [facility]    Transfusion Status OK TO TRANSFUSE    Unit Number [id number]    Blood Component Type THAWED PLASMA    Unit division 00    Status of Unit REL FROM [facility]    Transfusion Status OK TO TRANSFUSE    Unit Number [id number]    Blood Component Type THAWED PLASMA    Unit division 00    Status of Unit REL FROM [facility]    Transfusion Status OK TO TRANSFUSE    Unit Number [id number]    Blood Component Type THAWED PLASMA    Unit division 00    Status of Unit REL FROM [facility]    Transfusion Status OK TO TRANSFUSE    Unit Number [id number]    Blood Component Type THAWED PLASMA    Unit division 00    Status of Unit REL FROM [facility]    Transfusion Status OK TO TRANSFUSE    Unit Number [id number]    Blood Component Type THAWED PLASMA  Unit division 00    Status of Unit REL FROM [facility]    Transfusion Status OK TO TRANSFUSE    Unit Number [id number]    Blood Component Type THAWED PLASMA    Unit division 00    Status of Unit REL FROM [facility]    Transfusion Status OK TO TRANSFUSE    Unit Number [id number]    Blood Component Type THAWED PLASMA    Unit division 00    Status of Unit REL FROM [facility]    Transfusion Status OK TO TRANSFUSE   CBC     Status: Abnormal   Collection Time: 12/06/17  5:17 AM  Result Value Ref Range   WBC 6.6 4.0 - 10.5 K/uL    Comment: WHITE COUNT CONFIRMED ON SMEAR ADJUSTED FOR NUCLEATED RBC'S    RBC 2.76 (L) 3.87 - 5.11 MIL/uL   Hemoglobin 8.5 (L) 12.0 - 15.0 g/dL   HCT 25.6 (L) 36.0 - 46.0 %   MCV 92.8 78.0 - 100.0 fL    Comment: DELTA CHECK NOTED REPEATED TO VERIFY    MCH 30.8 26.0 - 34.0 pg   MCHC 33.2 30.0 - 36.0 g/dL   RDW 19.7 (H) 11.5 - 15.5 %   Platelets 14 (LL) 150 - 400 K/uL     Comment: REPEATED TO VERIFY CRITICAL VALUE NOTED.  VALUE IS CONSISTENT WITH PREVIOUSLY REPORTED AND CALLED VALUE.   Fibrinogen     Status: Abnormal   Collection Time: 12/06/17  5:17 AM  Result Value Ref Range   Fibrinogen 140 (L) 210 - 475 mg/dL  Reticulocytes     Status: Abnormal   Collection Time: 12/06/17  5:17 AM  Result Value Ref Range   Retic Ct Pct 3.0 0.4 - 3.1 %   RBC. 2.76 (L) 3.87 - 5.11 MIL/uL   Retic Count, Absolute 82.8 19.0 - 186.0 K/uL  Ferritin     Status: Abnormal   Collection Time: 12/06/17  5:17 AM  Result Value Ref Range   Ferritin 395 (H) 11 - 307 ng/mL  Iron and TIBC     Status: Abnormal   Collection Time: 12/06/17  5:17 AM  Result Value Ref Range   Iron 195 (H) 28 - 170 ug/dL   TIBC 225 (L) 250 - 450 ug/dL   Saturation Ratios 87 (H) 10.4 - 31.8 %   UIBC 30 ug/dL    Current Facility-Administered Medications  Medication Dose Route Frequency Provider Last Rate Last Dose  . 0.9 %  sodium chloride infusion   Intravenous Once Lavina Hamman, MD      . acetaminophen (TYLENOL) tablet 650 mg  650 mg Oral Q6H PRN Lavina Hamman, MD   650 mg at 12/05/17 0457   Or  . acetaminophen (TYLENOL) suppository 650 mg  650 mg Rectal Q6H PRN Lavina Hamman, MD      . amLODipine (NORVASC) tablet 5 mg  5 mg Oral Daily Lavina Hamman, MD   Stopped at 12/06/17 1000  . calcium carbonate (TUMS - dosed in mg elemental calcium) 500 MG chewable tablet        Stopped at 12/06/17 0830  . citrate dextrose (ACD-A anticoagulant) 0.73-2.45-2.2 GM/100ML solution        Stopped at 12/06/17 0930  . cloNIDine (CATAPRES - Dosed in mg/24 hr) patch 0.1 mg  0.1 mg Transdermal Q Renea Ee, Josetta Huddle, MD   0.1 mg at 12/06/17 1222  . folic acid (FOLVITE) tablet 1 mg  1 mg Oral Daily  Lavina Hamman, MD   1 mg at 12/05/17 1105  . haloperidol lactate (HALDOL) injection 2 mg  2 mg Intravenous Q6H PRN Lavina Hamman, MD   2 mg at 12/06/17 0810  . hydrALAZINE (APRESOLINE) injection 10 mg  10 mg  Intravenous Q4H PRN Lavina Hamman, MD   10 mg at 12/06/17 0814  . lactulose (CHRONULAC) 10 GM/15ML solution 20 g  20 g Oral Daily Lavina Hamman, MD   20 g at 12/04/17 1833  . levothyroxine (SYNTHROID, LEVOTHROID) tablet 112 mcg  112 mcg Oral QAC breakfast Lavina Hamman, MD   112 mcg at 12/05/17 0820  . LORazepam (ATIVAN) injection 2-3 mg  2-3 mg Intravenous Q1H PRN Lavina Hamman, MD   2 mg at 12/05/17 1003  . MEDLINE mouth rinse  15 mL Mouth Rinse BID Lavina Hamman, MD   15 mL at 12/06/17 0052  . multivitamin with minerals tablet 1 tablet  1 tablet Oral Daily Lavina Hamman, MD   1 tablet at 12/04/17 0910  . nicotine (NICODERM CQ - dosed in mg/24 hours) patch 14 mg  14 mg Transdermal Daily Lavina Hamman, MD   14 mg at 12/06/17 1221  . ondansetron (ZOFRAN) tablet 4 mg  4 mg Oral Q6H PRN Lavina Hamman, MD       Or  . ondansetron [facility]) injection 4 mg  4 mg Intravenous Q6H PRN Lavina Hamman, MD      . polyethylene glycol (MIRALAX / GLYCOLAX) packet 17 g  17 g Oral Daily PRN Lavina Hamman, MD      . sodium chloride flush (NS) 0.9 % injection 3 mL  3 mL Intravenous Q12H Lavina Hamman, MD   3 mL at 12/06/17 0052  . thiamine (VITAMIN B-1) tablet 100 mg  100 mg Oral Daily Lavina Hamman, MD   100 mg at 12/05/17 1105    Musculoskeletal: Strength & Muscle Tone: unable to test Gait & Station: unable to stand Patient leans: N/A  Psychiatric Specialty Exam: Physical Exam  Psychiatric: Thought content normal. Her affect is blunt. Her speech is slurred. She is withdrawn. Cognition and memory are impaired. She is inattentive.    Review of Systems  Unable to perform ROS: Mental status change  Neurological: Positive for tremors.  Psychiatric/Behavioral: Positive for substance abuse.    Blood pressure (!) 143/93, pulse (!) 124, temperature 98.1 F (36.7 C), temperature source Oral, resp. rate (!) 24, height 5' 3"  (1.6 m), weight 73.5 kg (162 lb 0.6 oz), SpO2 100 %.Body mass index  is 28.7 kg/m.  General Appearance: Casual  Eye Contact:  Minimal  Speech:  Garbled, Pressured and Slurred  Volume:  Decreased  Mood:  Dysphoric  Affect:  Blunt  Thought Process:  Disorganized  Orientation:  Other:  only to person  Thought Content:  unable to assess  Suicidal Thoughts:  unable to assess  Homicidal Thoughts:  unable to assess  Memory:  unable to assess  Judgement:  Impaired  Insight:  Lacking  Psychomotor Activity:  Increased  Concentration:  Concentration: Poor and Attention Span: Poor  Recall:  Poor  Fund of Knowledge:  unable to assess  Language:  Fair  Akathisia:  No  Handed:  Right  AIMS (if indicated):     Assets:  Social Support  ADL's:  Impaired  Cognition:  Impaired,  Mild  Sleep:   poor     Treatment Plan Summary: 52 year old woman  with history of Bipolar disorder and Polysubstance dependence who was admitted due to confusion and weak. She was found to have Thrombocytopenia on admission. Also, her urine toxicology was positive for multiple illicit substances. She is currently confused and disoriented which makes it difficulty to obtain information from her.  Plan/Recommendations: -Continue Haldol 60m IM/IV as needed for agitation. -Continue Lorazepam detox protocol. -Consider Clonidine detox protocol -urine positive for Methadone -Discontinue mood stabilizer or other antipsychotic medication except Haldol due to low platelet level. -Consider referring patient to substance abuse treatment when he is medically cleared.  Disposition: Patient does not meet criteria for psychiatric inpatient admission.                      Re-consult psychiatric service when patient is more alert.  ACorena Pilgrim MD 12/06/2017 3:00 PM

## 2017-12-06 NOTE — Progress Notes (Signed)
Text sent to Dr Allena KatzPatel that Aunt would like to talk with him about not allowing boyfriend to visit.

## 2017-12-06 NOTE — Plan of Care (Signed)
Patient remained confused and oriented to self only. She is able to follow simple commands. Will continue to monitor and assess patient.

## 2017-12-06 NOTE — Consult Note (Signed)
Neurology Consultation Reason for Consult: Altered mental status Referring Physician: Edsel Petrin patel   History is obtained from: Chart review   HPI: Claire Salinas is a 52 y.o. female PMH of hypothyroidism, DVT, bipolar disorder, polysubstance abuse; admitted on 12/03/2017, presented with complaint of generalized weakness in the setting thrombocytopenia, encephalopathy, hemolytic anemia, acute injury - initially thought to be TTP. Received 3 cycles of plasmapheresis. However further workup revealed a low normal Adams T 13 and TTP ruled out. Patient still seems  encephalopathic waxing and waning of mental status. Neurology consultation for further recommendations  ROS: Unable to obtain due to altered mental status.   Past Medical History:  Diagnosis Date  . Bipolar disorder (H[facility])   . DVT (deep venous thrombosis) (HCC)   . Hypothyroidism      History reviewed. No pertinent family history.   Social History:  reports that she has been smoking cigarettes.  She has been smoking about 1.00 pack per day. She uses smokeless tobacco. She reports that she drinks about 33.6 oz of alcohol per week. She reports that she uses drugs. Drugs: Cocaine, Marijuana, Methamphetamines, and Amphetamines.   Exam: Current vital signs: BP (!) 167/132   Pulse (!) 115   Temp 98.1 F (36.7 C) (Oral)   Resp (!) 24   Ht 5\' 3"  (1.6 m)   Wt 73.5 kg (162 lb 0.6 oz)   SpO2 100%   BMI 28.70 kg/m  Vital signs in last 24 hours: Temp:  [97.6 F (36.4 C)-98.3 F (36.8 C)] 98.1 F (36.7 C) (01/06 1222) Pulse Rate:  [96-124] 115 (01/06 1800) Resp:  [12-24] 24 (01/06 1222) BP: (128-181)/(87-140) 167/132 (01/06 1800) SpO2:  [97 %-100 %] 100 % (01/06 1222) Weight:  [73.5 kg (162 lb 0.6 oz)] 73.5 kg (162 lb 0.6 oz) (01/06 0549)   Physical Exam  Constitutional: Appears obese.  Psych: Flat Eyes: No scleral injection HENT: No OP obstrucion Head: Normocephalic.  Cardiovascular: Normal rate and regular rhythm.   Respiratory: Effort normal, non-labored breathing GI: Soft.  No distension. There is no tenderness.  Skin: WDI  Neuro: Mental Status: Patient is awake, states her name, follows simple commands Not speaking in sentences Cranial Nerves: II: Visual Fields are full. Pupils are equal, round, and reactive to light.   III,IV, VI: EOMI without ptosis or diploplia.  V: Facial sensation is symmetric to temperature VII: Facial movement is symmetric.  VIII: hearing is intact to voice X: Uvula elevates symmetrically XI: Shoulder shrug is symmetric. XII: tongue is midline without atrophy or fasciculations.  Motor: Tone is normal. Bulk is normal. All 4 extremities with good strength Sensory: Unable to test sensory accurately, Deep Tendon Reflexes: 1+ and symmetric in the biceps and patellae.  Plantars: Toes are downgoing bilaterally.  Cerebellar: FNF and HKS not assessed  Reviewed her CT head, MRI brain and EEG results. Review lab findings.    ASSESSMENT AND PLAN  Toxic Metabolic Encephalopathy  Likely Multifactorial - substance abuse, alcohol abuse with possible alcohol withdrawal, sepsis, hypothyroidism, bipolar disorder, mild hyperammonemia , acute kidney injury and anemia requiring transfusion, DIC with plasmapheresis, hypertensive urgency.  MRI brain negative for any acute infarct/PRES or other acute abnormality. A routine EEG generalized slowing, but no epileptiform activity.  Recommendations Continue folate, thiamine  continue correcting hyperthyroidism  Hold sedating medications unless needed, d/c gabapentin  Low dose Seroquel 12.5 mg for agitation if Qtc not prolonged  No further neurological workup needed at this time - suspect will gradually improve  IF  no improvement inspite of addressing above reasons, please feel free to reconsult us.  Georgiana SpinnerSushanth Gjon Letarte MD [facility] [phone]  If 7pm to 7am, please call on call as listed on AMION.
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
## 2017-12-06 NOTE — Progress Notes (Signed)
DR. Mosetta PuttFeng called HD Unit to cancel Apheresis treatment going forward.
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
## 2017-12-06 NOTE — Progress Notes (Signed)
Claire Salinas   DOB:1966-08-20   MI#:[id number]   MGN#:[id number]  Hematology follow up note   Subjective: Patient still quite confused, and has hallucination also. She has been hypertensive, plt remains to be low, 14K today. (+) Ecchymosis on her extremities, no other active bleeding.  Objective:  Vitals:   12/06/17 1000 12/06/17 1222  BP: (!) 158/113 (!) 143/93  Pulse: 96 (!) 124  Resp: 14 (!) 24  Temp:  98.1 F (36.7 C)  SpO2: 100% 100%    Body mass index is 28.7 kg/m.  Intake/Output Summary (Last 24 hours) at 12/06/2017 1514 Last data filed at 12/06/2017 1235 Gross per 24 hour  Intake 2413 ml  Output 2775 ml  Net -362 ml     Sclerae unicteric  Oropharynx clear  No peripheral adenopathy  Lungs clear -- no rales or rhonchi  Heart regular rate and rhythm  Abdomen benign  MSK no focal spinal tenderness, (+) anasarca in all extremities   Neuro disoriented, does not follow commands, moves all extremities   CBG (last 3)  No results for input(s): GLUCAP in the last 72 hours.   Labs:  CBC Latest Ref Rng & Units 12/06/2017 12/05/2017 12/05/2017  WBC 4.0 - 10.5 K/uL 6.6 - 3.2(L)  Hemoglobin 12.0 - 15.0 g/dL 8.5(L) 7.5(L) 7.1(L)  Hematocrit 36.0 - 46.0 % 25.6(L) 22.0(L) 20.8(L)  Platelets 150 - 400 K/uL 14(LL) - 24(LL)    CMP Latest Ref Rng & Units 12/05/2017 12/05/2017 12/04/2017  Glucose 65 - 99 mg/dL 107(H) 105(H) 109(H)  BUN 6 - 20 mg/dL 30(H) 34(H) 44(H)  Creatinine 0.44 - 1.00 mg/dL 1.20(H) 1.44(H) 2.31(H)  Sodium 135 - 145 mmol/L 138 137 133(L)  Potassium 3.5 - 5.1 mmol/L 3.1(L) 3.1(L) 3.7  Chloride 101 - 111 mmol/L 98(L) 101 101  CO2 22 - 32 mmol/L - 27 23  Calcium 8.9 - 10.3 mg/dL - 9.7 8.2(L)  Total Protein 6.5 - 8.1 g/dL - 4.6(L) -  Total Bilirubin 0.3 - 1.2 mg/dL - 2.6(H) -  Alkaline Phos 38 - 126 U/L - 91 -  AST 15 - 41 U/L - 67(H) -  ALT 14 - 54 U/L - 31 -    Urine Studies No results for input(s): UHGB, CRYS in the last 72 hours.  Invalid input(s): UACOL, UAPR,  USPG, UPH, UTP, UGL, UKET, UBIL, UNIT, UROB, [place], UEPI, UWBC, [place], [place], [place], [place], [place]  Basic Metabolic Panel: Recent Labs  Lab 12/03/17 1926 12/04/17 0011 12/04/17 0500 12/05/17 0425 12/05/17 0906  NA 129* 131* 133* 137 138  K 3.8 4.0 3.7 3.1* 3.1*  CL 101 103 101 101 98*  CO2 17*  --  23 27  --   GLUCOSE 118* 111* 109* 105* 107*  BUN 48* 40* 44* 34* 30*  CREATININE 2.73* 2.70* 2.31* 1.44* 1.20*  CALCIUM 7.8*  --  8.2* 9.7  --   MG 1.6*  --  2.0  --   --   PHOS 4.8*  --  4.0 3.0  --    GFR Estimated Creatinine Clearance: 52.6 mL/min (A) (by C-G formula based on SCr of 1.2 mg/dL (H)). Liver Function Tests: Recent Labs  Lab 12/03/17 1926 12/04/17 0500 12/05/17 0425  AST 161*  --  67*  ALT 60*  --  31  ALKPHOS 78  --  91  BILITOT 3.3*  --  2.6*  PROT 3.8*  --  4.6*  ALBUMIN 2.1* 2.2* 2.6*  2.6*   No results for input(s): LIPASE, AMYLASE  in the last 168 hours. No results for input(s): AMMONIA in the last 168 hours. Coagulation profile Recent Labs  Lab 12/03/17 1926 12/04/17 0500  INR 1.58 1.14    CBC: Recent Labs  Lab 12/03/17 1926 12/04/17 0011 12/04/17 0500 12/05/17 0425 12/05/17 0906 12/06/17 0517  WBC 6.5  --  4.0 3.2*  --  6.6  NEUTROABS 5.5  --   --   --   --   --   HGB 9.7* 9.9* 8.5* 7.1* 7.5* 8.5*  HCT 28.6* 29.0* 25.3* 20.8* 22.0* 25.6*  MCV 97.3  --  97.7 99.5  --  92.8  PLT 28*  28*  --  24* 24*  --  14*   Cardiac Enzymes: Recent Labs  Lab 12/03/17 1926  CKTOTAL 686*   BNP: Invalid input(s): POCBNP CBG: No results for input(s): GLUCAP in the last 168 hours. D-Dimer Recent Labs    12/03/17 1926  DDIMER 3.38*   Hgb A1c No results for input(s): HGBA1C in the last 72 hours. Lipid Profile No results for input(s): CHOL, HDL, LDLCALC, TRIG, CHOLHDL, LDLDIRECT in the last 72 hours. Thyroid function studies Recent Labs    12/03/17 1830  TSH 7.108*   Anemia work up Recent Labs    12/03/17 1830 12/03/17 1926  12/06/17 0517  VITAMINB12  --  413  --   FOLATE 3.6*  --   --   FERRITIN  --   --  395*  TIBC  --   --  225*  IRON  --   --  195*  RETICCTPCT  --  1.6 3.0   Microbiology Recent Results (from the past 240 hour(s))  MRSA PCR Screening     Status: None   Collection Time: 12/03/17  5:33 PM  Result Value Ref Range Status   MRSA by PCR NEGATIVE NEGATIVE Final    Comment:        The GeneXpert MRSA Assay (FDA approved for NASAL specimens only), is one component of a comprehensive MRSA colonization surveillance program. It is not intended to diagnose MRSA infection nor to guide or monitor treatment for MRSA infections.   Culture, Urine     Status: None   Collection Time: 12/03/17  6:15 PM  Result Value Ref Range Status   Specimen Description URINE, RANDOM  Final   Special Requests NONE  Final   Culture NO GROWTH  Final   Report Status 12/04/2017 FINAL  Final  Culture, blood (routine x 2)     Status: None (Preliminary result)   Collection Time: 12/03/17  9:03 PM  Result Value Ref Range Status   Specimen Description BLOOD LEFT ANTECUBITAL  Final   Special Requests IN PEDIATRIC BOTTLE Blood Culture adequate volume  Final   Culture NO GROWTH 3 DAYS  Final   Report Status PENDING  Incomplete  Culture, blood (routine x 2)     Status: None (Preliminary result)   Collection Time: 12/03/17  9:03 PM  Result Value Ref Range Status   Specimen Description BLOOD RIGHT HAND  Final   Special Requests IN PEDIATRIC BOTTLE Blood Culture adequate volume  Final   Culture NO GROWTH 3 DAYS  Final   Report Status PENDING  Incomplete    Adamts 13 Activity >66.8 % 61.3 Abnormally low       Studies:  Mr Brain Wo Contrast  Result Date: 12/04/2017 CLINICAL DATA:  52 y/o F; generalized weakness, TTP, acute kidney injury, acute encephalopathy. EXAM: MRI HEAD WITHOUT CONTRAST TECHNIQUE: Multiplanar, multiecho  pulse sequences of the brain and surrounding structures were obtained without intravenous  contrast. COMPARISON:  12/03/2017 CT head. FINDINGS: Brain: No acute infarction, hemorrhage, hydrocephalus, extra-axial collection or mass lesion. Few nonspecific foci of T2 FLAIR hyperintense signal abnormality in subcortical and periventricular white matter are compatible with mild chronic microvascular ischemic changes for age. Mild brain parenchymal volume loss. Vascular: Normal flow voids. Skull and upper cervical spine: Normal marrow signal. Sinuses/Orbits: Negative. Other: None. IMPRESSION: 1. No acute intracranial abnormality identified. 2. Mild chronic microvascular ischemic changes and mild parenchymal volume loss of the brain. Electronically Signed   By: Kristine Garbe M.D.   On: 12/04/2017 22:03    Assessment: 52 y.o.  female, with past medical history of bipolar, polysubstance abuse, hypothyroidism, DVT, was transferred from outside hospital for presumed TTP.  1. Severe thrombocytopenia, secondary to bactrim vs polysubstance abuse, mild DIC, unlikely TTP  2. Acute metabolic encephalopathy, multifactorial, including TTP, AKI, polysubstance abuse, elevated amonia, and hypothyroidism 3.  AKI, new resolved now  4.  Polysubstance abuse, including alcohol, cocaine, marijuana, methadone 5. Hyponatremia, resolved  6.  Transaminitis, possibly related to alcohol and substance abuse 7. Coagulapathy (low fibrinogen, mildly elevated PT), DIC vs liver etiology  8.  Transaminitis and elevated amonia, likely secondary to alcohol and polysubstance abuse 9. Hypertension  10, worsening anemia, received 2u RBC on 1/5   Recommendations: -Her ADMTS13 came back today which was 61.3%, this is close to the normal range, so it will basically rule out TTP.  And clinically her thrombocytopenia has not responded to plasma exchange -plasma exchange stopped, I informed dialysis nurse today  -I think her underlying thrombocytopenia is likely related to Bactrim and or polysubstance abuse, she also has  mild DIC on lab, which will probably contribute to it -She is not actively bleeding, continue to watch CBC, no need for platelet transfusion unless she has active bleeding or platelet counts 10K or less -if her plt dose not recover in 2-3 weeks, may consider a bone marrow biopsy  -I will follow up her daily lab, and see her as needed -I tried calling her daughter [place] at [phone] but could not reach her or leave a message. I called her aunt Mariann Laster and left her a message.   Truitt Merle, MD 12/06/2017

## 2017-12-06 NOTE — Evaluation (Signed)
Clinical/Bedside Swallow Evaluation Patient Details  Name: Claire LyonsKimberly C Laing MRN: [id number] Date of Birth: 07/21/1966  Today's Date: 12/06/2017 Time: SLP Start Time (ACUTE ONLY): 1420 SLP Stop Time (ACUTE ONLY): 1436 SLP Time Calculation (min) (ACUTE ONLY): 16 min  Past Medical History:  Past Medical History:  Diagnosis Date  . Bipolar disorder (HCC)   . DVT (deep venous thrombosis) (HCC)   . Hypothyroidism    Past Surgical History:  Past Surgical History:  Procedure Laterality Date  . TONSILLECTOMY     HPI:  Pt. 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   Assessment / Plan / Recommendation Clinical Impression  Patient presents with altered mental status and fluctuating alertness, putting her at increased risk for aspiration. Pt is alert with cues, and with assistance is able to take cup and straw sips of thin liquids with no overt signs of aspiration and appearance of timely swallow initiation, even with challenging with consecutive swallows in excess of 3 oz. RN reports pt would not swallow medications in applesauce. Refuses puree, but does accept teaspoons of pudding. Transits orally after mild holding with verbal cues. Mild residue clears with liquid wash. Recommend full liquids, straws OK, with meds in pudding vs applesauce; crush or break larger medications. Check for pocketing and follow with sips of liquid if necessary. Anticipate advancement as her mentation improves; will f/u.  SLP Visit Diagnosis: Dysphagia, unspecified (R13.10)    Aspiration Risk  Moderate aspiration risk    Diet Recommendation Thin liquid   Liquid Administration via: Cup;Straw Medication Administration: Crushed with puree(can follow with sips of liquid) Supervision: Staff to assist with self feeding;Full  supervision/cueing for compensatory strategies Compensations: Slow rate;Small sips/bites;Minimize environmental distractions Postural Changes: Seated upright at 90 degrees    Other  Recommendations Oral Care Recommendations: Oral care QID   Follow up Recommendations None      Frequency and Duration min 2x/week  1 week       Prognosis Prognosis for Safe Diet Advancement: Good Barriers to Reach Goals: Cognitive deficits      Swallow Study   General Date of Onset: 12/03/17 HPI: Pt. 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 Type of Study: Bedside Swallow Evaluation Previous Swallow Assessment: none on file Temperature Spikes Noted: No Respiratory Status: Nasal cannula History of Recent Intubation: No Behavior/Cognition: Lethargic/Drowsy;Distractible;Requires cueing;Cooperative Oral Cavity Assessment: Within Functional Limits Oral Care Completed by SLP: Yes Oral Cavity - Dentition: Missing dentition;Edentulous Vision: Functional for self-feeding Self-Feeding Abilities: Needs assist;Needs set up Patient Positioning: Upright in bed Baseline Vocal Quality: Normal Volitional Cough: Cognitively unable to elicit Volitional Swallow: Unable to elicit    Oral/Motor/Sensory Function Overall Oral Motor/Sensory Function: Generalized oral weakness   Ice Chips Ice chips: Within functional limits Presentation: Spoon   Thin Liquid Thin Liquid: Within functional limits Presentation: Cup;Straw    Nectar Thick Nectar Thick Liquid: Not tested   Honey Thick Honey Thick Liquid: Not tested   Puree Puree: Impaired Presentation: Spoon Oral Phase Impairments: Poor awareness of bolus Oral Phase Functional Implications: Oral holding;Prolonged oral transit   Solid   GO   Solid: Not tested       Rondel BatonMary Beth  Enzo Treu, MS, CCC-SLP Speech-Language Pathologist [phone]  Arlana LindauMary E Reynold Mantell 12/06/2017,2:51 PM

## 2017-12-06 NOTE — Progress Notes (Signed)
MD Paged and notified of high blood pressure. LBP 168/110 (128) taken at 0400. Patient's BP has remained consistent through-out the shift. Will continue to monitor and assess.

## 2017-12-07 LAB — BASIC METABOLIC PANEL
Anion gap: 11 (ref 5–15)
BUN: 19 mg/dL (ref 6–20)
CHLORIDE: 96 mmol/L — AB (ref 101–111)
CO2: 28 mmol/L (ref 22–32)
CREATININE: 0.68 mg/dL (ref 0.44–1.00)
Calcium: 8 mg/dL — ABNORMAL LOW (ref 8.9–10.3)
GFR calc Af Amer: >60 mL/min (ref >60)
GFR calc non Af Amer: >60 mL/min (ref >60)
Glucose, Bld: 79 mg/dL (ref 65–99)
POTASSIUM: 2.9 mmol/L — AB (ref 3.5–5.1)
Sodium: 135 mmol/L (ref 135–145)

## 2017-12-07 LAB — CBC WITH DIFFERENTIAL/PLATELET
Basophils Absolute: 0.1 10*3/uL (ref 0.0–0.1)
Basophils Relative: 2 %
Eosinophils Absolute: 0 10*3/uL (ref 0.0–0.7)
Eosinophils Relative: 0 %
HCT: 24.3 % — ABNORMAL LOW (ref 36.0–46.0)
Hemoglobin: 8 g/dL — ABNORMAL LOW (ref 12.0–15.0)
LYMPHS ABS: 1.6 10*3/uL (ref 0.7–4.0)
Lymphocytes Relative: 21 %
MCH: 30.7 pg (ref 26.0–34.0)
MCHC: 32.9 g/dL (ref 30.0–36.0)
MCV: 93.1 fL (ref 78.0–100.0)
MONO ABS: 0.7 10*3/uL (ref 0.1–1.0)
Monocytes Relative: 10 %
NEUTROS PCT: 67 %
Neutro Abs: 5 10*3/uL (ref 1.7–7.7)
PLATELETS: 8 10*3/uL — AB (ref 150–400)
RBC: 2.61 MIL/uL — AB (ref 3.87–5.11)
RDW: 21.1 % — AB (ref 11.5–15.5)
WBC: 7.4 10*3/uL (ref 4.0–10.5)

## 2017-12-07 LAB — POCT I-STAT, CHEM 8
BUN: 37 mg/dL — ABNORMAL HIGH (ref 6–20)
CALCIUM ION: 0.96 mmol/L — AB (ref 1.15–1.40)
CHLORIDE: 98 mmol/L — AB (ref 101–111)
CREATININE: 1.9 mg/dL — AB (ref 0.44–1.00)
GLUCOSE: 103 mg/dL — AB (ref 65–99)
HCT: 25 % — ABNORMAL LOW (ref 36.0–46.0)
Hemoglobin: 8.5 g/dL — ABNORMAL LOW (ref 12.0–15.0)
POTASSIUM: 3.7 mmol/L (ref 3.5–5.1)
Sodium: 136 mmol/L (ref 135–145)
TCO2: 23 mmol/L (ref 22–32)

## 2017-12-07 LAB — COMPREHENSIVE METABOLIC PANEL
ALBUMIN: 2.4 g/dL — AB (ref 3.5–5.0)
ALT: 54 U/L (ref 14–54)
ANION GAP: 10 (ref 5–15)
AST: 128 U/L — AB (ref 15–41)
Alkaline Phosphatase: 133 U/L — ABNORMAL HIGH (ref 38–126)
BILIRUBIN TOTAL: 5.1 mg/dL — AB (ref 0.3–1.2)
BUN: 19 mg/dL (ref 6–20)
CHLORIDE: 95 mmol/L — AB (ref 101–111)
CO2: 31 mmol/L (ref 22–32)
Calcium: 8.1 mg/dL — ABNORMAL LOW (ref 8.9–10.3)
Creatinine, Ser: 0.71 mg/dL (ref 0.44–1.00)
GFR calc Af Amer: >60 mL/min (ref >60)
Glucose, Bld: 78 mg/dL (ref 65–99)
POTASSIUM: 2.2 mmol/L — AB (ref 3.5–5.1)
Sodium: 136 mmol/L (ref 135–145)
TOTAL PROTEIN: 4.1 g/dL — AB (ref 6.5–8.1)

## 2017-12-07 LAB — PROTIME-INR
INR: 1.23
PROTHROMBIN TIME: 15.4 s — AB (ref 11.4–15.2)

## 2017-12-07 LAB — HOMOCYSTEINE: HOMOCYSTEINE-NORM: 76.1 umol/L — AB (ref 0.0–15.0)

## 2017-12-07 LAB — MAGNESIUM
MAGNESIUM: 1.4 mg/dL — AB (ref 1.7–2.4)
Magnesium: 1.8 mg/dL (ref 1.7–2.4)

## 2017-12-07 MED ORDER — OXYMETAZOLINE HCL 0.05 % NA SOLN
1.0000 | Freq: Two times a day (BID) | NASAL | Status: DC
  Administered 2017-12-07 – 2017-12-15 (×14): 1 via NASAL
  Filled 2017-12-07 (×3): qty 15

## 2017-12-07 MED ORDER — POTASSIUM CHLORIDE 10 MEQ/50ML IV SOLN
10.0000 meq | INTRAVENOUS | Status: AC
  Administered 2017-12-07 (×2): 10 meq via INTRAVENOUS
  Filled 2017-12-07 (×3): qty 50

## 2017-12-07 MED ORDER — MAGNESIUM SULFATE 2 GM/50ML IV SOLN
2.0000 g | Freq: Once | INTRAVENOUS | Status: AC
  Administered 2017-12-07: 2 g via INTRAVENOUS
  Filled 2017-12-07: qty 50

## 2017-12-07 MED ORDER — POTASSIUM CHLORIDE CRYS ER 20 MEQ PO TBCR
40.0000 meq | EXTENDED_RELEASE_TABLET | Freq: Once | ORAL | Status: AC
  Administered 2017-12-07: 40 meq via ORAL
  Filled 2017-12-07: qty 2

## 2017-12-07 MED ORDER — SODIUM CHLORIDE 0.9 % IV SOLN
Freq: Once | INTRAVENOUS | Status: AC
  Administered 2017-12-07: 12:00:00 via INTRAVENOUS

## 2017-12-07 MED ORDER — POTASSIUM CHLORIDE 10 MEQ/50ML IV SOLN
10.0000 meq | INTRAVENOUS | Status: AC
  Administered 2017-12-07 (×3): 10 meq via INTRAVENOUS
  Filled 2017-12-07 (×2): qty 50
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
## 2017-12-07 NOTE — Progress Notes (Signed)
[facility] Progress Note  Patient: Claire Salinas JYN:[id number]   PCP: Kela Millin, MD DOB: [birth year]   DOA: 12/03/2017   DOS: 12/07/2017   Date of Service: the patient was seen and examined on 12/07/2017  Subjective: More awake, less agitated.  No acute complaints.  Has some nasal bleeding.  Brief hospital course: Pt. 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 Currently further plan is continue current care.  Assessment and Plan: 1. AKI (acute kidney injury) (HCC) resolved With oliguria on presentation now resolved. Patient presented from outside hospital with Foley catheter in place. Continue strict in and out. No obstructive uropathy on ultrasound identified. UA here shows bland urine Nephrology consulted, appreciate input.  Now signed off. Plasmapheresis started and stopped. No indication for HD right now.   Consider removing Foley catheter tomorrow. Most likely combination of prerenal as well as renal etiology. Mild elevation of CK Patient may have hypertension emergency as well since she has been positive for cocaine at home which might have caused renal insufficiency.  2.    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 Drug induced thrombocytopenia.  Patient was primarily transferred to [facility] because for concern for TTP HUS-like syndrome. Hematology has been consulted.  Highly appreciate their input. Low fibrinogen, elevated INR, elevated d-dimer, worsening thrombocytopenia as well as schistocytes on the smear with acute encephalopathy consistent with the diagnosis of TTP.   Started on plasmapheresis, completed 3 cycles.   ADMTS 13 came back 61.3 which essentially rules out TTP.Currently based on this further plasmapheresis are discontinued. Drug-induced possibility due to patient  being on Bactrim as well as multiple substance abuse. Hypertension from cocaine can be also possibility. Blood culture negative for 2 days ruling out infection. Currently steroids are also stopped. Worsening on 12/06/2017-due to dilution from PRBC. Further worsening on 12/08/2007, S/P transfusion one platelet.  Maintain platelet count more than 10.  May require resumption of steroids. Plasmapheresis catheter has been removed.  3.  Acute metabolic encephalopathy. Etiology not clear, probably multifactorial. TTP-HUS, acute metabolic encephalopathy from acute kidney injury, delirium tremens from alcohol withdrawal, substance abuse, hypothyroidism with noncompliance with Synthroid.  All these are actively presenting this patient. CT head unremarkable for any acute abnormality. Mild elevation of ammonia, continue daily lactulose. Unremarkable MRI brain and EEG shows metabolic encephalopathy. PT OT consulted.  Recommends SNF Avoid psychotropic medications unless indicated. Folic acid low, replacing, thiamine replaced, B12 replaced. Patient shows some agitation and hallucination post HD .  Haldol added. No significant improvement in patient's condition in fact some worsening requiring speech therapy consult for swallowing. Psychiatry as well as neurology both consulted.  Recommend continue current therapy.  4.  Hypothyroidism. Noncompliance with Synthroid. TSH was 8.18 at the facility. recheck TSH 7.1 and free T4 0.88. Continue Synthroid dose. Cortisol level 60 appropriate for stress.  5.  Alcohol abuse. Drinks 8 glasses of vodka on daily basis. CIWA protocol.  Monitor for DT  6.  Polysubstance abuse. Monitor for now. CIWA protocol.  7.  Multiple bruises. Patient denies any injury or assault. Likely from thrombocytopenia as well as ataxia. Monitor. Would need to rediscuss regarding harm at home once patient's mentation is clear. Social worker consulted.  8.  Active  smoker. Nicotine patch for now. Unable to discuss quitting and counseling due to mental status changes.  9.  Elevated LFT. Fatty liver. AST  ALT elevation consistent with alcoholic liver disease. Bilirubin elevated with indirect bilirubin primarily elevated consistent with hemolysis. Mild elevation of LDH as well. Recheck the labs. Hep B negative Avoid hepatotoxic medication, Tylenol less than 2 g okay.  10.  Central line. Presenting from outside facility with a central line present in right IJ. Chest x-ray shows tip in upper SVC region.  Good blood draw.  Use as a peripheral IV but do not use for pressors.  11.  Suspected UTI. Patient was treated with some IV antibiotics in the outside facility I do not have any information available regarding this on MAR. Currently will monitor clinically.  12.  Acute anemia. Hemoglobin is worsening most likely dilutional with hydration. Hematology recommends 2 PRBC transfusion, ordered.  Diet: Renal diet, aspiration precaution DVT Prophylaxis: mechanical compression device  Advance goals of care discussion: full code, step mother who has adopted the patient, is the primary decision maker for the patient.  Discussed with patient's legal sister(aunt) at bedside.  They wanted to consider DNR/DNI for the patient, I recommended to monitor for next few days allowing plasmapheresis to work on TTP.  Sister will talk with mother and inform us regarding their final decision  Family Communication: no family was present at bedside, at the time of interview.  This with patient's sister on 12/04/2017.  Disposition:  Discharge to [facility]  Consultants: Nephrology, hematology, CCM Procedures: HD catheter placement, plasmapheresis, EEG  Antibiotics: Anti-infectives (From admission, onward)   None       Objective: Physical Exam: Vitals:   12/07/17 1407 12/07/17 1433 12/07/17 1500 12/07/17 1742  BP: 123/79 128/88 130/88 (!) 129/99  Pulse: 94  (!) 107 (!)  103  Resp: 14  16 13   Temp: 98.4 F (36.9 C) 97.7 F (36.5 C) 97.8 F (36.6 C) 98.1 F (36.7 C)  TempSrc: Oral Oral Oral Oral  SpO2:   96% 93%  Weight:      Height:        Intake/Output Summary (Last 24 hours) at 12/07/2017 1803 Last data filed at 12/07/2017 1730 Gross per 24 hour  Intake 375.67 ml  Output 1050 ml  Net -674.33 ml   Filed Weights   12/05/17 0312 12/06/17 0549 12/07/17 0500  Weight: 71.3 kg (157 lb 3 oz) 73.5 kg (162 lb 0.6 oz) 71.1 kg (156 lb 12 oz)   General: Alert, Awake and Oriented to Person. Appear in moderate distress, affect blunted Eyes: PERRL, Conjunctiva normal ENT: Oral Mucosa clear dry. Neck: difficult to assess JVD, no Abnormal Mass Or lumps Cardiovascular: S1 and S2 Present, aortic systolic Murmur, Peripheral Pulses Present Respiratory: normal respiratory effort, Bilateral Air entry equal and Decreased, no use of accessory muscle, Clear to Auscultation, no Crackles, no wheezes Abdomen: Bowel Sound present, Soft and no tenderness, no hernia Skin: no redness, no Rash, no induration Extremities: trace Pedal edema, no calf tenderness Neurologic:  mental status AAOx3, speech normal, attention abnormal, needs multiple redirection as well as keeps on repeating same and again and again. Cranial Nerves PERRL, EOM normal and present, Motor strength bilateral equal, 3 out of 5. Sensation present to painful stimuli,  Reflexes difficult to elicit knee and biceps, babinski difficult to elicit,  Cerebellar test difficult to elicit. Gait not checked due to patient safety concerns.  Data Reviewed: CBC: Recent Labs  Lab 12/03/17 1926  12/04/17 0500 12/04/17 1632 12/05/17 0425 12/05/17 0906 12/06/17 0517 12/07/17 0556  WBC 6.5  --  4.0  --  3.2*  --  6.6 7.4  NEUTROABS 5.5  --   --   --   --   --   --  5.0  HGB 9.7*   < > 8.5* 8.5* 7.1* 7.5* 8.5* 8.0*  HCT 28.6*   < > 25.3* 25.0* 20.8* 22.0* 25.6* 24.3*  MCV 97.3  --  97.7  --  99.5  --  92.8 93.1  PLT  28*  28*  --  24*  --  24*  --  14* 8*   < > = values in this interval not displayed.   Basic Metabolic Panel: Recent Labs  Lab 12/03/17 1926  12/04/17 0500 12/04/17 1632 12/05/17 0425 12/05/17 0906 12/07/17 0708  NA 129*   < > 133* 136 137 138 136  K 3.8   < > 3.7 3.7 3.1* 3.1* 2.2*  CL 101   < > 101 98* 101 98* 95*  CO2 17*  --  23  --  27  --  31  GLUCOSE 118*   < > 109* 103* 105* 107* 78  BUN 48*   < > 44* 37* 34* 30* 19  CREATININE 2.73*   < > 2.31* 1.90* 1.44* 1.20* 0.71  CALCIUM 7.8*  --  8.2*  --  9.7  --  8.1*  MG 1.6*  --  2.0  --   --   --  1.4*  PHOS 4.8*  --  4.0  --  3.0  --   --    < > = values in this interval not displayed.    Liver Function Tests: Recent Labs  Lab 12/03/17 1926 12/04/17 0500 12/05/17 0425 12/07/17 0708  AST 161*  --  67* 128*  ALT 60*  --  31 54  ALKPHOS 78  --  91 133*  BILITOT 3.3*  --  2.6* 5.1*  PROT 3.8*  --  4.6* 4.1*  ALBUMIN 2.1* 2.2* 2.6*  2.6* 2.4*   No results for input(s): LIPASE, AMYLASE in the last 168 hours. No results for input(s): AMMONIA in the last 168 hours. Coagulation Profile: Recent Labs  Lab 12/03/17 1926 12/04/17 0500 12/07/17 0708  INR 1.58 1.14 1.23   Cardiac Enzymes: Recent Labs  Lab 12/03/17 1926  CKTOTAL 686*   BNP (last 3 results) No results for input(s): PROBNP in the last 8760 hours. CBG: No results for input(s): GLUCAP in the last 168 hours. Studies: No results found.  Scheduled Meds: . amLODipine  5 mg Oral Daily  . cloNIDine  0.1 mg Transdermal Q Sun  . folic acid  1 mg Oral Daily  . lactulose  20 g Oral Daily  . levothyroxine  112 mcg Oral QAC breakfast  . mouth rinse  15 mL Mouth Rinse BID  . multivitamin with minerals  1 tablet Oral Daily  . nicotine  14 mg Transdermal Daily  . oxymetazoline  1 spray Each Nare BID  . sodium chloride flush  3 mL Intravenous Q12H  . thiamine  100 mg Oral Daily   Continuous Infusions: . potassium chloride 10 mEq (12/07/17 1715)   PRN  Meds: acetaminophen **OR** acetaminophen, haloperidol lactate, hydrALAZINE, LORazepam, ondansetron **OR** ondansetron (ZOFRAN) IV, polyethylene glycol  Time spent: The patient is critically ill with multiple organ systems failure and requires high complexity decision making for assessment and support, frequent evaluation and titration of therapies. Critical Care Time devoted to patient care services described in this note is 35 minutes   Author: Lynden OxfordPranav Becky Berberian, MD [facility] Pager: [phone] 12/07/2017  6:03 PM  If 7PM-7AM, please contact night-coverage at [email or web address], password [facility]

## 2017-12-08 LAB — CBC WITH DIFFERENTIAL/PLATELET
BASOS ABS: 0.1 10*3/uL (ref 0.0–0.1)
BASOS PCT: 1 %
EOS ABS: 0 10*3/uL (ref 0.0–0.7)
Eosinophils Relative: 0 %
HCT: 21.1 % — ABNORMAL LOW (ref 36.0–46.0)
Hemoglobin: 7 g/dL — ABNORMAL LOW (ref 12.0–15.0)
LYMPHS PCT: 18 %
Lymphs Abs: 1.3 10*3/uL (ref 0.7–4.0)
MCH: 31.8 pg (ref 26.0–34.0)
MCHC: 33.2 g/dL (ref 30.0–36.0)
MCV: 95.9 fL (ref 78.0–100.0)
Monocytes Absolute: 0.6 10*3/uL (ref 0.1–1.0)
Monocytes Relative: 8 %
NEUTROS PCT: 73 %
Neutro Abs: 5.2 10*3/uL (ref 1.7–7.7)
PLATELETS: 38 10*3/uL — AB (ref 150–400)
RBC: 2.2 MIL/uL — ABNORMAL LOW (ref 3.87–5.11)
RDW: 21.9 % — ABNORMAL HIGH (ref 11.5–15.5)
WBC: 7.2 10*3/uL (ref 4.0–10.5)

## 2017-12-08 LAB — CBC
HEMATOCRIT: 27.5 % — AB (ref 36.0–46.0)
Hemoglobin: 8.7 g/dL — ABNORMAL LOW (ref 12.0–15.0)
MCH: 29.2 pg (ref 26.0–34.0)
MCHC: 31.6 g/dL (ref 30.0–36.0)
MCV: 92.3 fL (ref 78.0–100.0)
PLATELETS: 30 10*3/uL — AB (ref 150–400)
RBC: 2.98 MIL/uL — ABNORMAL LOW (ref 3.87–5.11)
RDW: 22.3 % — AB (ref 11.5–15.5)
WBC: 7.8 10*3/uL (ref 4.0–10.5)

## 2017-12-08 LAB — BASIC METABOLIC PANEL
Anion gap: 9 (ref 5–15)
BUN: 14 mg/dL (ref 6–20)
CALCIUM: 8 mg/dL — AB (ref 8.9–10.3)
CO2: 30 mmol/L (ref 22–32)
CREATININE: 0.69 mg/dL (ref 0.44–1.00)
Chloride: 97 mmol/L — ABNORMAL LOW (ref 101–111)
GFR calc non Af Amer: >60 mL/min (ref >60)
Glucose, Bld: 86 mg/dL (ref 65–99)
Potassium: 2.7 mmol/L — CL (ref 3.5–5.1)
SODIUM: 136 mmol/L (ref 135–145)

## 2017-12-08 LAB — BPAM PLATELET PHERESIS
BLOOD PRODUCT EXPIRATION DATE: 201901080805
ISSUE DATE / TIME: 201901071343
Unit Type and Rh: 6200

## 2017-12-08 LAB — PREPARE PLATELET PHERESIS: Unit division: 0

## 2017-12-08 LAB — MAGNESIUM: MAGNESIUM: 1.7 mg/dL (ref 1.7–2.4)

## 2017-12-08 LAB — CRYOGLOBULIN

## 2017-12-08 LAB — HEPATITIS C ANTIBODY: HCV AB: 0.2 {s_co_ratio} (ref 0.0–0.9)

## 2017-12-08 LAB — PREPARE RBC (CROSSMATCH)

## 2017-12-08 LAB — CULTURE, BLOOD (ROUTINE X 2)
CULTURE: NO GROWTH
Culture: NO GROWTH
SPECIAL REQUESTS: ADEQUATE
Special Requests: ADEQUATE

## 2017-12-08 LAB — HEMOGLOBIN AND HEMATOCRIT, BLOOD
HCT: 21.5 % — ABNORMAL LOW (ref 36.0–46.0)
HEMOGLOBIN: 7.1 g/dL — AB (ref 12.0–15.0)

## 2017-12-08 MED ORDER — SODIUM CHLORIDE 0.9 % IV SOLN
Freq: Once | INTRAVENOUS | Status: AC
  Administered 2017-12-08: 14:00:00 via INTRAVENOUS

## 2017-12-08 MED ORDER — POTASSIUM CHLORIDE CRYS ER 20 MEQ PO TBCR
40.0000 meq | EXTENDED_RELEASE_TABLET | ORAL | Status: AC
  Administered 2017-12-08 (×3): 40 meq via ORAL
  Filled 2017-12-08 (×3): qty 2

## 2017-12-08 MED ORDER — TAMSULOSIN HCL 0.4 MG PO CAPS
0.4000 mg | ORAL_CAPSULE | Freq: Every day | ORAL | Status: DC
  Administered 2017-12-08 – 2017-12-14 (×7): 0.4 mg via ORAL
  Filled 2017-12-08 (×7): qty 1

## 2017-12-08 NOTE — Consult Note (Signed)
Chief Complaint: Patient was seen in consultation today for thrombocytopenia  Referring Physician(s): Dr. Burr Medico  Supervising Physician: Arne Cleveland  Patient Status: [facility] - In-pt  History of Present Illness: Claire Salinas is a 52 y.o. female with past medical history of bipolar disorder and polysubstance abuse who was admitted 12/03/17 with weakness and acute encephalopathy.  She is found to have thrombocytopenia and has been evaluated by hematology/oncology who recommend a bone marrow biopsy.   IR consulted for bone marrow biopsy at the request of Dr. Burr Medico.    Past Medical History:  Diagnosis Date  . Bipolar disorder ([place])   . DVT (deep venous thrombosis) ([place])   . Hypothyroidism     Past Surgical History:  Procedure Laterality Date  . TONSILLECTOMY      Allergies: Patient has no known allergies.  Medications: Prior to Admission medications   Medication Sig Start Date End Date Taking? Authorizing Provider  levothyroxine (SYNTHROID, LEVOTHROID) 112 MCG tablet Take 112 mcg by mouth Salinas before breakfast.   Yes [provider]     History reviewed. No pertinent family history.  Social History   Socioeconomic History  . Marital status: Single    Spouse name: None  . Number of children: None  . Years of education: None  . Highest education level: None  Social Needs  . Financial resource strain: None  . Food insecurity - worry: None  . Food insecurity - inability: None  . Transportation needs - medical: None  . Transportation needs - non-medical: None  Occupational History  . None  Tobacco Use  . Smoking status: Current Every Day Smoker    Packs/day: 1.00    Types: Cigarettes  . Smokeless tobacco: Current User  Substance and Sexual Activity  . Alcohol use: Yes    Alcohol/week: 33.6 oz    Types: 56 Shots of liquor per week    Comment: Vodka  . Drug use: Yes    Types: Cocaine, Marijuana, Methamphetamines, Amphetamines  . Sexual  activity: Yes  Other Topics Concern  . None  Social History Narrative  . None    Review of Systems  Constitutional: Negative for fatigue and fever.  Respiratory: Negative for cough and shortness of breath.   Cardiovascular: Negative for chest pain.  Gastrointestinal: Negative for abdominal pain and anal bleeding.    Vital Signs: BP (!) 167/95 (BP Location: Left Arm)   Pulse (!) 112   Temp 98.6 F (37 C) (Oral)   Resp 15   Ht 5' 3"  (1.6 m)   Wt 156 lb 12 oz (71.1 kg)   SpO2 98%   BMI 27.77 kg/m   Physical Exam  Constitutional: She appears well-developed.  Cardiovascular: Normal rate, regular rhythm and normal heart sounds.  Pulmonary/Chest: Effort normal and breath sounds normal. No respiratory distress.  Abdominal: Soft.  Neurological: She is alert.  Skin: Skin is warm and dry.  Psychiatric: She has a normal mood and affect. Her behavior is normal. Cognition and memory are impaired.  Nursing note and vitals reviewed.   Imaging: Ct Head Wo Contrast  Result Date: 12/03/2017 CLINICAL DATA:  Initial evaluation for acute altered mental status. EXAM: CT HEAD WITHOUT CONTRAST TECHNIQUE: Contiguous axial images were obtained from the base of the skull through the vertex without intravenous contrast. COMPARISON:  None available. FINDINGS: Brain: Cerebral volume within normal limits for patient age. Mild chronic small vessel ischemic changes within the cerebral white matter. No evidence for acute intracranial hemorrhage. No findings to  suggest acute large vessel territory infarct. No mass lesion, midline shift, or mass effect. Ventricles are normal in size without evidence for hydrocephalus. No extra-axial fluid collection identified. Vascular: No hyperdense vessel identified. Skull: Scalp soft tissues demonstrate no acute abnormality.Calvarium intact. Soft tissue calcification noted at the left frontal scalp near the vertex, of doubtful significance. Sinuses/Orbits: Globes and orbital  soft tissues are within normal limits. Visualized paranasal sinuses are clear. No mastoid effusion. IMPRESSION: 1. No acute intracranial abnormality. 2. Mild chronic microvascular ischemic disease. Electronically Signed   By: Jeannine Boga M.D.   On: 12/03/2017 23:22   Mr Brain Wo Contrast  Result Date: 12/04/2017 CLINICAL DATA:  52 y/o F; generalized weakness, TTP, acute kidney injury, acute encephalopathy. EXAM: MRI HEAD WITHOUT CONTRAST TECHNIQUE: Multiplanar, multiecho pulse sequences of the brain and surrounding structures were obtained without intravenous contrast. COMPARISON:  12/03/2017 CT head. FINDINGS: Brain: No acute infarction, hemorrhage, hydrocephalus, extra-axial collection or mass lesion. Few nonspecific foci of T2 FLAIR hyperintense signal abnormality in subcortical and periventricular white matter are compatible with mild chronic microvascular ischemic changes for age. Mild brain parenchymal volume loss. Vascular: Normal flow voids. Skull and upper cervical spine: Normal marrow signal. Sinuses/Orbits: Negative. Other: None. IMPRESSION: 1. No acute intracranial abnormality identified. 2. Mild chronic microvascular ischemic changes and mild parenchymal volume loss of the brain. Electronically Signed   By: Kristine Garbe M.D.   On: 12/04/2017 22:03   Dg Chest Port 1 View  Result Date: 12/03/2017 CLINICAL DATA:  Shortness of breath EXAM: PORTABLE CHEST 1 VIEW COMPARISON:  None. FINDINGS: A right central venous catheter is in place with tip projected over the right clavicular head. The catheter is somewhat laterally positioned but this is likely due to patient rotation. Tip is probably in the upper SVC region. Prominent right paratracheal soft tissues. This is likely vascular but lymphadenopathy is a less likely consideration. No pneumothorax. No blunting of costophrenic angles. No focal consolidation. Linear atelectasis or fibrosis in the left lung base. Heart size and pulmonary  vascularity are normal. IMPRESSION: Right central venous catheter tip is in the upper SVC region. Prominent right paratracheal soft tissues are likely vascular. Lymphadenopathy is a less likely consideration. Linear atelectasis or fibrosis in the left lung base. Electronically Signed   By: Lucienne Capers M.D.   On: 12/03/2017 20:58   Dg Chest Port 1v Same Day  Result Date: 12/03/2017 CLINICAL DATA:  Central line insertion.  Site infection. EXAM: PORTABLE CHEST 1 VIEW COMPARISON:  12/03/2017 FINDINGS: Since the previous study, there is interval placement of a left central venous catheter with tip over the low SVC region. No pneumothorax. An indwelling right central venous catheter is unchanged in position with tip over the upper SVC region. Shallow inspiration with linear atelectasis in the lung bases. Heart size and pulmonary vascularity are normal. Prominent right paratracheal soft tissue is likely vascular and may be related to patient rotation. IMPRESSION: Left central venous catheter with tip over the low SVC region. Right central venous catheter with tip over the upper SVC region. Shallow inspiration with atelectasis in lung bases. Electronically Signed   By: Lucienne Capers M.D.   On: 12/03/2017 22:52    Labs:  CBC: Recent Labs    12/05/17 0425  12/06/17 0517 12/07/17 0556 12/08/17 0535 12/08/17 0830  WBC 3.2*  --  6.6 7.4 7.2  --   HGB 7.1*   < > 8.5* 8.0* 7.0* 7.1*  HCT 20.8*   < > 25.6* 24.3*  21.1* 21.5*  PLT 24*  --  14* 8* 38*  --    < > = values in this interval not displayed.    COAGS: Recent Labs    12/03/17 1926 12/04/17 0500 12/07/17 0708  INR 1.58 1.14 1.23  APTT 31  --   --     BMP: Recent Labs    12/05/17 0425 12/05/17 0906 12/07/17 0708 12/07/17 2030 12/08/17 0830  NA 137 138 136 135 136  K 3.1* 3.1* 2.2* 2.9* 2.7*  CL 101 98* 95* 96* 97*  CO2 27  --  31 28 30   GLUCOSE 105* 107* 78 79 86  BUN 34* 30* 19 19 14   CALCIUM 9.7  --  8.1* 8.0* 8.0*    CREATININE 1.44* 1.20* 0.71 0.68 0.69  GFRNONAA 41*  --  >60 >60 >60  GFRAA 47*  --  >60 >60 >60    LIVER FUNCTION TESTS: Recent Labs    12/03/17 1926 12/04/17 0500 12/05/17 0425 12/07/17 0708  BILITOT 3.3*  --  2.6* 5.1*  AST 161*  --  67* 128*  ALT 60*  --  31 54  ALKPHOS 78  --  91 133*  PROT 3.8*  --  4.6* 4.1*  ALBUMIN 2.1* 2.2* 2.6*  2.6* 2.4*    TUMOR MARKERS: No results for input(s): AFPTM, CEA, CA199, CHROMGRNA in the last 8760 hours.  Assessment and Plan: Thrombocytopenia Patient with weakness and confusion at home is found to have low platelets.  IR consulted for bone marrow biopsy at the request of Dr. Burr Medico.  Her mother, Claire Salinas, is available for consent.  Discussed procedure with both patient and her mother.  Risks and benefits discussed with the patient including, but not limited to bleeding, infection, damage to adjacent structures or low yield requiring additional tests. All of the patient's questions were answered, patient's mother is agreeable to proceed. Consent signed. Anticipate procedure tomorrow.  Will make NPO after midnight.   Thank you for this interesting consult.  I greatly enjoyed meeting Claire Salinas and look forward to participating in their care.  A copy of this report was sent to the requesting provider on this date.  Electronically Signed: Docia Barrier, PA 12/08/2017, 11:48 AM   I spent a total of 40 Minutes    in face to face in clinical consultation, greater than 50% of which was counseling/coordinating care for thrombocytopenia.

## 2017-12-08 NOTE — Progress Notes (Signed)
Physical Therapy Treatment Patient Details Name: Claire Salinas MRN: [id number] DOB: 08/13/1966 Today's Date: 12/08/2017    History of Present Illness Claire LyonsKimberly C Salinas is a 52 y.o. female with Past medical history of hypothyroidism, DVT, bipolar disorder, alcohol; uses drugs. Drugs: Cocaine, Marijuana, Methamphetamines, and Amphetamines. [facility] for concern of TTP.  [facility] for right arm numbness and weakness.  Found to have UTI so prescribed Bactrim.  Returned 1/2 with worsening confusion.  Found to have anemia, thrombocytopenia, AKI. Overnight 1/3, hematology had concern that she needed transfer to ICU for hypotension and AMS.    PT Comments    Pt admitted with above diagnosis. Pt currently with functional limitations due to balance and endurance deficits.  Pt was able to sit EOB for 10 minutes with varying assist min to max assist.  Pt distracted as her nose was bleeding with nurse aware.  Changed gown and washed pts hands as she kept picking at nose.  Pt very distracted and needs constant support due to poor postural stability.  Left pt in chair position.   Pt will benefit from skilled PT to increase their independence and safety with mobility to allow discharge to the venue listed below.     Follow Up Recommendations  SNF;Supervision/Assistance - 24 hour     Equipment Recommendations  None recommended by PT    Recommendations for Other Services       Precautions / Restrictions Precautions Precautions: Fall Restrictions Weight Bearing Restrictions: No    Mobility  Bed Mobility Overal bed mobility: Needs Assistance Bed Mobility: Supine to Sit;Sit to Supine     Supine to sit: +2 for physical assistance;Max assist;Mod assist Sit to supine: +2 for physical assistance;Max assist   General bed mobility comments: Pt attempts to assist however pt with poor coordination and poor trunk stability.  Pt needing incr  assist for LEs and for elevation of trunk.  Pt distracted as her nose was bleeding and she kept trying to pick at nose constantly.    Transfers                 General transfer comment: unsafe/unable to attempt  Ambulation/Gait                 Stairs            Wheelchair Mobility    Modified Rankin (Stroke Patients Only)       Balance Overall balance assessment: Needs assistance Sitting-balance support: Feet supported;Bilateral upper extremity supported Sitting balance-Leahy Scale: Poor Sitting balance - Comments: fluctuating between requiring max A to min A; Pt was able to sit EOB but losing balance in all directions with poor trunk coordination.  Pt kept worrying about her nose bleed and kept reaching for her nose with her hands with poor coordination of hands moving them poorly with dysmetria as she has poor coordination of movement and poor grip.  Overall, pt with impulsive movements and poor postural control, making her sitting balance poor. Pt losing balance anterior and posteriorly at times.  Postural control: Posterior lean                                  Cognition Arousal/Alertness: Awake/alert Behavior During Therapy: Flat affect Overall Cognitive Status: Difficult to assess Area of Impairment: Attention;Following commands;Problem solving  Current Attention Level: Focused   Following Commands: Follows one step commands with increased time;Follows one step commands inconsistently     Problem Solving: Slow processing;Decreased initiation;Difficulty sequencing;Requires verbal cues;Requires tactile cues        Exercises      General Comments General comments (skin integrity, edema, etc.): Left pt in close to full chair position with UEs on pillows.       Pertinent Vitals/Pain Pain Assessment: No/denies pain   VSS Home Living                      Prior Function            PT Goals  (current goals can now be found in the care plan section) Acute Rehab PT Goals Patient Stated Goal: unable to state Progress towards PT goals: Progressing toward goals    Frequency    Min 2X/week      PT Plan Current plan remains appropriate    Co-evaluation              AM-PAC PT "6 Clicks" Daily Activity  Outcome Measure  Difficulty turning over in bed (including adjusting bedclothes, sheets and blankets)?: Unable Difficulty moving from lying on back to sitting on the side of the bed? : Unable Difficulty sitting down on and standing up from a chair with arms (e.g., wheelchair, bedside commode, etc,.)?: Unable Help needed moving to and from a bed to chair (including a wheelchair)?: Total Help needed walking in hospital room?: Total Help needed climbing 3-5 steps with a railing? : Total 6 Click Score: 6    End of Session   Activity Tolerance: Patient limited by fatigue Patient left: in bed;with call bell/phone within reach;with bed alarm set Nurse Communication: Mobility status;Need for lift equipment PT Visit Diagnosis: Other abnormalities of gait and mobility (R26.89);Muscle weakness (generalized) (M62.81)     Time: 1610-9604 PT Time Calculation (min) (ACUTE ONLY): 19 min  Charges:  $Therapeutic Activity: 8-22 mins                    G Codes:       Lassie Demorest,PT Acute Rehabilitation [phone] (pager)    Berline Lopes 12/08/2017, 10:12 AM

## 2017-12-08 NOTE — Progress Notes (Signed)
Patient had not voided since foley removal. Bladder scan >247. Notified Dr. Allena KatzPatel. Order given to I&O cath and flomax ordered. This nurse went in to patient's room to complete I&O cath. Unable patient was incont of urine. Patient voided incont x2. Patient bathed and purewick applied. Will continue to monitor.

## 2017-12-08 NOTE — Progress Notes (Signed)
[facility] Progress Note  Patient: Claire Salinas RWE:[id number]   PCP: [name], MD DOB: [birth year]   DOA: 12/03/2017   DOS: 12/08/2017   Date of Service: the patient was seen and examined on 12/08/2017  Subjective: Remains awake.  No further bleeding.  No nausea no vomiting.  Minimal oral intake.  No diarrhea no constipation.  No significant agitation overnight.  Brief hospital course: Pt. 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 Currently further plan is continue current care.  Assessment and Plan: 1. AKI (acute kidney injury) ([place]) resolved With oliguria on presentation now resolved. Patient presented from outside hospital with Foley catheter in place. Continue strict in and out. No obstructive uropathy on ultrasound identified. UA here shows bland urine Nephrology consulted, appreciate input.  Now signed off. Plasmapheresis started and stopped. No indication for HD right now.   Consider removing Foley catheter tomorrow. Most likely combination of prerenal as well as renal etiology. Mild elevation of CK Patient may have hypertension emergency as well since she has been positive for cocaine at home which might have caused renal insufficiency.  2.    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 Drug induced thrombocytopenia. Recurrent anemia Patient was primarily transferred to [facility] because for concern for TTP HUS-like syndrome. Hematology has been consulted.  Highly appreciate their input. Low fibrinogen, elevated INR, elevated d-dimer, worsening thrombocytopenia as well as schistocytes on the smear with acute encephalopathy consistent with the diagnosis of TTP.   Started on plasmapheresis, completed 3 cycles.   ADMTS 13 came back 61.3 which essentially rules out TTP.Currently based  on this further plasmapheresis are discontinued. Drug-induced possibility due to patient being on Bactrim as well as multiple substance abuse. Hypertension from cocaine can be also possibility. Blood culture negative for 2 days ruling out infection. Currently steroids are also stopped. Worsening on 12/06/2017-due to dilution from PRBC. Further worsening on 12/08/2007, S/P transfusion one platelet.  Maintain platelet count more than 10.  May require resumption of steroids. Plasmapheresis catheter has been removed. Patient remains anemic although platelet improved with transfusion.  Also has bandemia on smear.  Hematology recommending bone marrow biopsy.  IR consulted for the same.  Appreciate input.  3.  Acute metabolic encephalopathy. Etiology not clear, probably multifactorial. TTP-HUS, acute metabolic encephalopathy from acute kidney injury, delirium tremens from alcohol withdrawal, substance abuse, hypothyroidism with noncompliance with Synthroid.  All these are actively presenting this patient. CT head unremarkable for any acute abnormality. Mild elevation of ammonia, continue daily lactulose. Unremarkable MRI brain and EEG shows metabolic encephalopathy. PT OT consulted.  Recommends SNF Avoid psychotropic medications unless indicated. Folic acid low, replacing, thiamine replaced, B12 replaced. Patient shows some agitation and hallucination post HD .  Haldol added. Shows improvement in her mentation.  Psychiatry neurology both consulted recommend no further workup at present.  4.  Hypothyroidism. Noncompliance with Synthroid. TSH was 8.18 at the facility. recheck TSH 7.1 and free T4 0.88. Continue Synthroid dose. Cortisol level 60 appropriate for stress.  5.  Alcohol abuse. Drinks 8 glasses of vodka on daily basis. CIWA protocol.  Monitor for DT  6.  Polysubstance abuse. Monitor for now. CIWA protocol.  7.  Multiple bruises. Patient denies any injury or assault. Likely from  thrombocytopenia as well as ataxia. Monitor. Would need to rediscuss regarding harm at home once patient's mentation is clear. Social  worker consulted.  8.  Active smoker. Nicotine patch for now. Unable to discuss quitting and counseling due to mental status changes.  9.  Elevated LFT. Fatty liver. AST ALT elevation consistent with alcoholic liver disease. Bilirubin elevated with indirect bilirubin primarily elevated consistent with hemolysis. Mild elevation of LDH as well. Recheck the labs. Hep B negative Avoid hepatotoxic medication, Tylenol less than 2 g okay.  10.  Central line. Now removed.  Peripheral IVs.  11.  Suspected UTI. Patient was treated with some IV antibiotics in the outside facility I do not have any information available regarding this on MAR. Currently will monitor clinically.  12.  Acute anemia. Hemoglobin is worsening most likely dilutional with hydration. Hematology recommends 2 PRBC transfusion, ordered.  Bone marrow biopsy ordered.  Diet: Cardiac diet, aspiration precaution dysphagia type I diet DVT Prophylaxis: mechanical compression device  Advance goals of care discussion: full code, step mother who has adopted the patient, is the primary decision maker for the patient.  Discussed with patient's legal sister(aunt) at bedside.  They wanted to consider DNR/DNI for the patient, I recommended to monitor.   Family Communication: no family was present at bedside, at the time of interview.  This with patient's sister on 12/04/2017.  Disposition:  Discharge to [facility]  Consultants: Nephrology, hematology, CCM Procedures: HD catheter placement, plasmapheresis, EEG  Antibiotics: Anti-infectives (From admission, onward)   None       Objective: Physical Exam: Vitals:   12/08/17 1354 12/08/17 1415 12/08/17 1449 12/08/17 1500  BP: (!) 157/112 (!) 151/100 (!) 146/118 124/84  Pulse: (!) 109 (!) 117 (!) 120   Resp: 18  18 11   Temp: 99.1 F (37.3 C)  99.3 F (37.4 C) 98.6 F (37 C)   TempSrc: Oral Oral Oral   SpO2: 97%  97% 97%  Weight:      Height:        Intake/Output Summary (Last 24 hours) at 12/08/2017 1547 Last data filed at 12/08/2017 1450 Gross per 24 hour  Intake 525.67 ml  Output 2025 ml  Net -1499.33 ml   Filed Weights   12/05/17 0312 12/06/17 0549 12/07/17 0500  Weight: 71.3 kg (157 lb 3 oz) 73.5 kg (162 lb 0.6 oz) 71.1 kg (156 lb 12 oz)   General: Alert, Awake and Oriented to Person. Appear in moderate distress, affect blunted Eyes: PERRL, Conjunctiva normal ENT: Oral Mucosa clear dry. Neck: difficult to assess JVD, no Abnormal Mass Or lumps Cardiovascular: S1 and S2 Present, aortic systolic Murmur, Peripheral Pulses Present Respiratory: normal respiratory effort, Bilateral Air entry equal and Decreased, no use of accessory muscle, Clear to Auscultation, no Crackles, no wheezes Abdomen: Bowel Sound present, Soft and no tenderness, no hernia Skin: no redness, no Rash, no induration Extremities: trace Pedal edema, no calf tenderness Neurologic:  mental status AAOx3, speech normal, attention abnormal, needs multiple redirection as well as keeps on repeating same and again and again. Cranial Nerves PERRL, EOM normal and present, Motor strength bilateral equal, 3 out of 5. Sensation present to painful stimuli,  Reflexes difficult to elicit knee and biceps, babinski difficult to elicit,  Cerebellar test difficult to elicit. Gait not checked due to patient safety concerns.  Data Reviewed: CBC: Recent Labs  Lab 12/03/17 1926  12/04/17 0500  12/05/17 0425 12/05/17 0906 12/06/17 0517 12/07/17 0556 12/08/17 0535 12/08/17 0830  WBC 6.5  --  4.0  --  3.2*  --  6.6 7.4 7.2  --   NEUTROABS 5.5  --   --   --   --   --   --  5.0 5.2  --   HGB 9.7*   < > 8.5*   < > 7.1* 7.5* 8.5* 8.0* 7.0* 7.1*  HCT 28.6*   < > 25.3*   < > 20.8* 22.0* 25.6* 24.3* 21.1* 21.5*  MCV 97.3  --  97.7  --  99.5  --  92.8 93.1 95.9  --   PLT  28*  28*  --  24*  --  24*  --  14* 8* 38*  --    < > = values in this interval not displayed.   Basic Metabolic Panel: Recent Labs  Lab 12/03/17 1926  12/04/17 0500  12/05/17 0425 12/05/17 0906 12/07/17 0708 12/07/17 2030 12/08/17 0830  NA 129*   < > 133*   < > 137 138 136 135 136  K 3.8   < > 3.7   < > 3.1* 3.1* 2.2* 2.9* 2.7*  CL 101   < > 101   < > 101 98* 95* 96* 97*  CO2 17*  --  23  --  27  --  31 28 30   GLUCOSE 118*   < > 109*   < > 105* 107* 78 79 86  BUN 48*   < > 44*   < > 34* 30* 19 19 14   CREATININE 2.73*   < > 2.31*   < > 1.44* 1.20* 0.71 0.68 0.69  CALCIUM 7.8*  --  8.2*  --  9.7  --  8.1* 8.0* 8.0*  MG 1.6*  --  2.0  --   --   --  1.4* 1.8 1.7  PHOS 4.8*  --  4.0  --  3.0  --   --   --   --    < > = values in this interval not displayed.    Liver Function Tests: Recent Labs  Lab 12/03/17 1926 12/04/17 0500 12/05/17 0425 12/07/17 0708  AST 161*  --  67* 128*  ALT 60*  --  31 54  ALKPHOS 78  --  91 133*  BILITOT 3.3*  --  2.6* 5.1*  PROT 3.8*  --  4.6* 4.1*  ALBUMIN 2.1* 2.2* 2.6*  2.6* 2.4*   No results for input(s): LIPASE, AMYLASE in the last 168 hours. No results for input(s): AMMONIA in the last 168 hours. Coagulation Profile: Recent Labs  Lab 12/03/17 1926 12/04/17 0500 12/07/17 0708  INR 1.58 1.14 1.23   Cardiac Enzymes: Recent Labs  Lab 12/03/17 1926  CKTOTAL 686*   BNP (last 3 results) No results for input(s): PROBNP in the last 8760 hours. CBG: No results for input(s): GLUCAP in the last 168 hours. Studies: No results found.  Scheduled Meds: . amLODipine  5 mg Oral Daily  . cloNIDine  0.1 mg Transdermal Q Sun  . folic acid  1 mg Oral Daily  . lactulose  20 g Oral Daily  . levothyroxine  112 mcg Oral QAC breakfast  . mouth rinse  15 mL Mouth Rinse BID  . multivitamin with minerals  1 tablet Oral Daily  . nicotine  14 mg Transdermal Daily  . oxymetazoline  1 spray Each Nare BID  . potassium chloride  40 mEq Oral Q2H  .  sodium chloride flush  3 mL Intravenous Q12H  . thiamine  100 mg Oral Daily   Continuous Infusions:  PRN Meds: acetaminophen **OR** acetaminophen, haloperidol lactate, hydrALAZINE, LORazepam, ondansetron **OR** ondansetron (ZOFRAN) IV, polyethylene glycol  Time spent: 35 minutes.  Author: Berle Mull, MD  [facility] Pager: [phone] 12/08/2017 3:47 PM  If 7PM-7AM, please contact night-coverage at [email or web address], password [email or web address]

## 2017-12-08 NOTE — NC FL2 (Signed)
[place] MEDICAID FL2 LEVEL OF CARE SCREENING TOOL     IDENTIFICATION  Patient Name: Claire Salinas Birthdate: Apr 13, 1966 Sex: female Admission Date (Current Location): 12/03/2017  [place] and [place] Number:  [place], [place])   Facility and Address:  [facility], [address] [place], [place] [address]      Provider Number: [id number]  Attending Physician Name and Address:  Rolly Salter, MD  Relative Name and Phone Number:       Current Level of Care: Hospital Recommended Level of Care: Skilled Nursing Facility Prior Approval Number:    Date Approved/Denied:   PASRR Number:    Discharge Plan: SNF    Current Diagnoses: Patient Active Problem List   Diagnosis Date Noted  . Polysubstance (including opioids) dependence with physiol dependence (HCC) 12/06/2017  . TTP (thrombotic thrombocytopenic purpura) (HCC)   . SOB (shortness of breath)   . Central line insertion site infection, initial encounter   . AKI (acute kidney injury) (HCC) 12/03/2017  . Acute metabolic encephalopathy 12/03/2017  . Polysubstance abuse (HCC) 12/03/2017  . Hypothyroidism 12/03/2017  . Thrombocytopenia (HCC) 12/03/2017  . Abnormal INR 12/03/2017  . LFT elevation 12/03/2017  . Alcohol abuse 12/03/2017  . Smoker 12/03/2017    Orientation RESPIRATION BLADDER Height & Weight     Self, Time, Situation, Place  Normal Continent Weight: 156 lb 12 oz (71.1 kg) Height:  5\' 3"  (160 cm)  BEHAVIORAL SYMPTOMS/MOOD NEUROLOGICAL BOWEL NUTRITION STATUS      Continent Diet(see DC summary)  AMBULATORY STATUS COMMUNICATION OF NEEDS Skin   Extensive Assist Verbally Bruising                       Personal Care Assistance Level of Assistance  Bathing, Dressing Bathing Assistance: Maximum assistance   Dressing Assistance: Maximum assistance     Functional Limitations Info             SPECIAL CARE FACTORS FREQUENCY  PT (By licensed PT), OT (By licensed OT)     PT Frequency: 5/wk OT Frequency: 5/wk            Contractures      Additional Factors Info  Code Status, Allergies Code Status Info: FULL Allergies Info: NKA           Current Medications (12/08/2017):  This is the current hospital active medication list Current Facility-Administered Medications  Medication Dose Route Frequency Provider Last Rate Last Dose  . 0.9 %  sodium chloride infusion   Intravenous Once Rolly Salter, MD      . acetaminophen (TYLENOL) tablet 650 mg  650 mg Oral Q6H PRN Rolly Salter, MD   650 mg at 12/07/17 1715   Or  . acetaminophen (TYLENOL) suppository 650 mg  650 mg Rectal Q6H PRN Rolly Salter, MD      . amLODipine (NORVASC) tablet 5 mg  5 mg Oral Daily Rolly Salter, MD   5 mg at 12/08/17 0925  . cloNIDine (CATAPRES - Dosed in mg/24 hr) patch 0.1 mg  0.1 mg Transdermal Q [place] Dach, Zachery Conch, MD   0.1 mg at 12/06/17 1222  . folic acid (FOLVITE) tablet 1 mg  1 mg Oral Daily Rolly Salter, MD   1 mg at 12/08/17 0925  . haloperidol lactate (HALDOL) injection 2 mg  2 mg Intravenous Q6H PRN Rolly Salter, MD   2 mg at 12/07/17 1240  . hydrALAZINE (APRESOLINE) injection  10 mg  10 mg Intravenous Q4H PRN Rolly SalterPatel, Pranav M, MD   10 mg at 12/07/17 1219  . lactulose (CHRONULAC) 10 GM/15ML solution 20 g  20 g Oral Daily Rolly SalterPatel, Pranav M, MD   20 g at 12/08/17 0925  . levothyroxine (SYNTHROID, LEVOTHROID) tablet 112 mcg  112 mcg Oral QAC breakfast Rolly SalterPatel, Pranav M, MD   112 mcg at 12/08/17 [id number]  . LORazepam (ATIVAN) injection 2-3 mg  2-3 mg Intravenous Q1H PRN Rolly SalterPatel, Pranav M, MD   2 mg at 12/05/17 1003  . MEDLINE mouth rinse  15 mL Mouth Rinse BID Rolly SalterPatel, Pranav M, MD   15 mL at 12/08/17 0926  . multivitamin with minerals tablet 1 tablet  1 tablet Oral Daily Rolly SalterPatel, Pranav M, MD   1 tablet at 12/08/17 0925  . nicotine (NICODERM CQ - dosed in mg/24 hours) patch 14 mg  14 mg Transdermal Daily Rolly SalterPatel, Pranav M, MD   14 mg at 12/06/17 1221  . ondansetron (ZOFRAN)  tablet 4 mg  4 mg Oral Q6H PRN Rolly SalterPatel, Pranav M, MD       Or  . ondansetron [facility](ZOFRAN) injection 4 mg  4 mg Intravenous Q6H PRN Rolly SalterPatel, Pranav M, MD      . oxymetazoline (AFRIN) 0.05 % nasal spray 1 spray  1 spray Each Nare BID Rolly SalterPatel, Pranav M, MD   1 spray at 12/07/17 1602  . polyethylene glycol (MIRALAX / GLYCOLAX) packet 17 g  17 g Oral Daily PRN Rolly SalterPatel, Pranav M, MD   17 g at 12/07/17 0355  . potassium chloride SA (K-DUR,KLOR-CON) CR tablet 40 mEq  40 mEq Oral Q2H Rolly SalterPatel, Pranav M, MD   40 mEq at 12/08/17 1033  . sodium chloride flush (NS) 0.9 % injection 3 mL  3 mL Intravenous Q12H Rolly SalterPatel, Pranav M, MD   3 mL at 12/07/17 2107  . thiamine (VITAMIN B-1) tablet 100 mg  100 mg Oral Daily Rolly SalterPatel, Pranav M, MD   100 mg at 12/08/17 [id number]     Discharge Medications: Please see discharge summary for a list of discharge medications.  Relevant Imaging Results:  Relevant Lab Results:   Additional Information SS#: [id number]  Burna SisUris, Samuella Rasool H, LCSW

## 2017-12-08 NOTE — Progress Notes (Signed)
I have reviewed her lab and chart, she responded to plt transfusion well, plt 38K this morning, however her H/H has dropped again, and I recommend PRBC transfusion today.  I recommend a bone marrow biopsy, to be done by IR tomorrow, order placed. I also informed IR.   I called pt's aunt Mariann Laster (leaglly her sister), she states her mother [place] [phone]) adopted pt when she was a kid and [place] is her next [place]. I spoke with her, discussed the potential complications, and she agrees with the procedure.    Truitt Merle  12/08/2017

## 2017-12-08 NOTE — Progress Notes (Signed)
Patient's potassium 2.7. Notified Dr. Allena KatzPatel. New orders given and carried out.

## 2017-12-08 NOTE — Progress Notes (Signed)
  Speech Language Pathology Treatment: Dysphagia  Patient Details Name: Claire Salinas MRN: [id number] DOB: 12/11/1965 Today's Date: 12/08/2017 Time: [id number] SLP Time Calculation (min) (ACUTE ONLY): 10 min  Assessment / Plan / Recommendation Clinical Impression  Pt's mentation seems improved from initial evaluation per documentation, but she exhibits prolonged mastication and has oral residue with small bites of chopped solids. Mod cues were provided for awareness and oral clearance. Recommend advancing diet to Dys 1 textures, continuing thin liquids, to increase nutritional content on tray while still providing textures that will be easier for her to transfer from her oral cavity. Will continue to follow - prognosis for advancement is likely good pending improved mentation, although she does have limited dentition.   HPI HPI: Pt. 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      SLP Plan  Continue with current plan of care       Recommendations  Diet recommendations: Dysphagia 1 (puree);Thin liquid Liquids provided via: Cup;Straw Medication Administration: Crushed with puree Supervision: Staff to assist with self feeding;Full supervision/cueing for compensatory strategies Compensations: Slow rate;Small sips/bites;Minimize environmental distractions Postural Changes and/or Swallow Maneuvers: Seated upright 90 degrees                Oral Care Recommendations: Oral care BID Follow up Recommendations: (tba) SLP Visit Diagnosis: Dysphagia, oral phase (R13.11) Plan: Continue with current plan of care       GO                Claire Salinas, Claire Salinas 12/08/2017, 11:30 AM  Claire Salinas, M.A. CCC-SLP [phone]
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
## 2017-12-09 ENCOUNTER — Inpatient Hospital Stay (HOSPITAL_COMMUNITY): Payer: Medicaid - Out of State

## 2017-12-09 DIAGNOSIS — D65 Disseminated intravascular coagulation [defibrination syndrome]: Secondary | ICD-10-CM

## 2017-12-09 DIAGNOSIS — E039 Hypothyroidism, unspecified: Secondary | ICD-10-CM

## 2017-12-09 LAB — TYPE AND SCREEN
ABO/RH(D): A NEG
Antibody Screen: NEGATIVE
Unit division: 0

## 2017-12-09 LAB — COMPREHENSIVE METABOLIC PANEL
ALK PHOS: 195 U/L — AB (ref 38–126)
ALT: 49 U/L (ref 14–54)
AST: 72 U/L — ABNORMAL HIGH (ref 15–41)
Albumin: 2.5 g/dL — ABNORMAL LOW (ref 3.5–5.0)
Anion gap: 7 (ref 5–15)
BILIRUBIN TOTAL: 4.5 mg/dL — AB (ref 0.3–1.2)
BUN: 9 mg/dL (ref 6–20)
CALCIUM: 8 mg/dL — AB (ref 8.9–10.3)
CO2: 26 mmol/L (ref 22–32)
CREATININE: 0.63 mg/dL (ref 0.44–1.00)
Chloride: 103 mmol/L (ref 101–111)
GFR calc Af Amer: >60 mL/min (ref >60)
Glucose, Bld: 81 mg/dL (ref 65–99)
Potassium: 3.6 mmol/L (ref 3.5–5.1)
SODIUM: 136 mmol/L (ref 135–145)
TOTAL PROTEIN: 4.5 g/dL — AB (ref 6.5–8.1)

## 2017-12-09 LAB — PROTIME-INR
INR: 1.16
Prothrombin Time: 14.7 seconds (ref 11.4–15.2)

## 2017-12-09 LAB — CBC WITH DIFFERENTIAL/PLATELET
BASOS PCT: 0 %
Basophils Absolute: 0 10*3/uL (ref 0.0–0.1)
EOS PCT: 0 %
Eosinophils Absolute: 0 10*3/uL (ref 0.0–0.7)
HEMATOCRIT: 26.4 % — AB (ref 36.0–46.0)
HEMOGLOBIN: 8.6 g/dL — AB (ref 12.0–15.0)
LYMPHS ABS: 1.1 10*3/uL (ref 0.7–4.0)
Lymphocytes Relative: 14 %
MCH: 30.6 pg (ref 26.0–34.0)
MCHC: 32.6 g/dL (ref 30.0–36.0)
MCV: 94 fL (ref 78.0–100.0)
MONOS PCT: 7 %
Monocytes Absolute: 0.5 10*3/uL (ref 0.1–1.0)
NEUTROS ABS: 6 10*3/uL (ref 1.7–7.7)
Neutrophils Relative %: 79 %
Platelets: 30 10*3/uL — ABNORMAL LOW (ref 150–400)
RBC: 2.81 MIL/uL — ABNORMAL LOW (ref 3.87–5.11)
RDW: 23.7 % — ABNORMAL HIGH (ref 11.5–15.5)
WBC: 7.6 10*3/uL (ref 4.0–10.5)

## 2017-12-09 LAB — BPAM RBC
Blood Product Expiration Date: 201902012359
ISSUE DATE / TIME: 201901081346
Unit Type and Rh: 600

## 2017-12-09 LAB — MAGNESIUM: Magnesium: 1.7 mg/dL (ref 1.7–2.4)

## 2017-12-09 MED ORDER — HYDROCODONE-ACETAMINOPHEN 5-325 MG PO TABS
1.0000 | ORAL_TABLET | Freq: Four times a day (QID) | ORAL | Status: DC | PRN
Start: 2017-12-09 — End: 2017-12-15
  Administered 2017-12-09 – 2017-12-10 (×2): 2 via ORAL
  Administered 2017-12-10: 1 via ORAL
  Administered 2017-12-11 – 2017-12-15 (×16): 2 via ORAL
  Filled 2017-12-09 (×17): qty 2
  Filled 2017-12-09: qty 1
  Filled 2017-12-09: qty 2

## 2017-12-09 MED ORDER — MIDAZOLAM HCL 2 MG/2ML IJ SOLN
INTRAMUSCULAR | Status: AC
  Filled 2017-12-09: qty 4

## 2017-12-09 MED ORDER — FENTANYL CITRATE (PF) 100 MCG/2ML IJ SOLN
INTRAMUSCULAR | Status: AC
  Administered 2017-12-09: 09:00:00
  Filled 2017-12-09: qty 4

## 2017-12-09 MED ORDER — FENTANYL CITRATE (PF) 100 MCG/2ML IJ SOLN
INTRAMUSCULAR | Status: AC | PRN
  Administered 2017-12-09: 50 ug via INTRAVENOUS

## 2017-12-09 MED ORDER — MIDAZOLAM HCL 2 MG/2ML IJ SOLN
INTRAMUSCULAR | Status: AC | PRN
  Administered 2017-12-09 (×2): 1 mg via INTRAVENOUS

## 2017-12-09 MED ORDER — LIDOCAINE HCL 1 % IJ SOLN
INTRAMUSCULAR | Status: AC
  Filled 2017-12-09: qty 20

## 2017-12-09 MED ORDER — SODIUM CHLORIDE 0.9 % IV SOLN
INTRAVENOUS | Status: AC | PRN
  Administered 2017-12-09: 30 mL/h via INTRAVENOUS
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
## 2017-12-09 NOTE — Sedation Documentation (Signed)
Awaiting transport.

## 2017-12-09 NOTE — Sedation Documentation (Signed)
Patient is resting comfortably.  Quiet, tolerating well.

## 2017-12-09 NOTE — Procedures (Signed)
  Procedure: CT R iliac bone marrow biopsy    EBL:   minimal Complications:  none immediate  See full dictation in [facility] PACS.  D. Amarionna Arca MD Main # [phone] Pager  [phone]    

## 2017-12-09 NOTE — Progress Notes (Signed)
Pt transferred from ICU this afternoon. She c/o generalized pain 8/10, Tylenol given 1725, no other PRN meds ordered. MD paged.

## 2017-12-09 NOTE — Progress Notes (Addendum)
  PROGRESS NOTE  Claire Salinas ZYS:[id number] DOB: 09/11/1966 DOA: 12/03/2017 PCP: [name], MD  Assessment/Plan DIC. TTP ruled out. Thrombocytopenia, anemia. - see detailed summary from Dr. [name] 1/8 - followed by hematology. - s/p bone marrow biopsy today - further recs per hematology - check CBC in AM  Acute metabolic encephalopathy, multifactorial. Extensive workup unrevealing. Seen by both psychiatry and neurology. Rec no further w/u at this time.  Hypothyroidism secondary to noncompliance with Synthroid. - continue levothyroxine  Alcohol abus - no evidence of withdrawal  AKI - resolved    DVT prophylaxis: SCDs Code Status: full Family Communication: none Disposition Plan: pending    [name], MD  [facility] Direct contact: [phone] --[place]  --[email or web address]; password [email or web address]  7PM-7AM contact night coverage as above 12/09/2017, 8:28 PM  LOS: 6 days   Interval history/Subjective: Reports pain all over.  Objective: Vitals:  Vitals:   12/09/17 1536 12/09/17 1627  BP: (!) 157/93 (!) 147/97  Pulse: 100 (!) 104  Resp: 12 20  Temp: 97.6 F (36.4 C) (!) 97.4 F (36.3 C)  SpO2: 99% 99%    Exam:  Constitutional:  . Appears anxious and uncomfortable, then calms down and is easily distracted during interview Eyes:  . pupils and irises appear normal . Normal lids  ENMT:  . grossly normal hearing  . Lips appear normal Respiratory:  . CTA bilaterally, no w/r/r.  . Respiratory effort normal.  Cardiovascular:  . RRR, no m/r/g . No LE extremity edema   Abdomen:  . Soft, nt Psychiatric:  . Mental status o Mood, affect appropriate  I have personally reviewed the following:   Labs:  CMP stable with hyperbilirubinemia  plts stable 30  Hgb stable 8.6  Scheduled Meds: . amLODipine  5 mg Oral Daily  . cloNIDine  0.1 mg Transdermal Q Sun  . fentaNYL      . folic acid  1 mg Oral Daily  . lactulose  20 g Oral  Daily  . levothyroxine  112 mcg Oral QAC breakfast  . mouth rinse  15 mL Mouth Rinse BID  . multivitamin with minerals  1 tablet Oral Daily  . nicotine  14 mg Transdermal Daily  . oxymetazoline  1 spray Each Nare BID  . sodium chloride flush  3 mL Intravenous Q12H  . tamsulosin  0.4 mg Oral QPC supper  . thiamine  100 mg Oral Daily   Continuous Infusions:  Principal Problem:   Polysubstance (including opioids) dependence with physiol dependence ([place]) Active Problems:   AKI (acute kidney injury) ([place])   Acute metabolic encephalopathy   Polysubstance abuse (H[facility])   Hypothyroidism   Thrombocytopenia (H[facility])   Abnormal INR   LFT elevation   Alcohol abuse   Smoker   TTP (thrombotic thrombocytopenic purpura) (HCC)   SOB (shortness of breath)   Central line insertion site infection, initial encounter   LOS: 6 days

## 2017-12-09 NOTE — Sedation Documentation (Addendum)
O2 4l/[place] in place, pt agitated, calling out, moving on table, reassurances and instructions given, extra table straps placed for safety.  MD called

## 2017-12-09 NOTE — Progress Notes (Signed)
Patient being transferred to 5W. Report called to receiving nurse. All questions answered. Patient's Creig Hinesunt Wanda notified of transferred/room number. All belongings sent with patient.

## 2017-12-09 NOTE — Sedation Documentation (Signed)
Dr Deanne CofferHassell at bedside, procedure explained.

## 2017-12-10 DIAGNOSIS — D759 Disease of blood and blood-forming organs, unspecified: Secondary | ICD-10-CM

## 2017-12-10 DIAGNOSIS — F1010 Alcohol abuse, uncomplicated: Secondary | ICD-10-CM

## 2017-12-10 DIAGNOSIS — F17200 Nicotine dependence, unspecified, uncomplicated: Secondary | ICD-10-CM

## 2017-12-10 DIAGNOSIS — R945 Abnormal results of liver function studies: Secondary | ICD-10-CM

## 2017-12-10 DIAGNOSIS — F1910 Other psychoactive substance abuse, uncomplicated: Secondary | ICD-10-CM

## 2017-12-10 LAB — CBC WITH DIFFERENTIAL/PLATELET
Basophils Absolute: 0 10*3/uL (ref 0.0–0.1)
Basophils Relative: 0 %
Eosinophils Absolute: 0.1 10*3/uL (ref 0.0–0.7)
Eosinophils Relative: 2 %
HCT: 27.9 % — ABNORMAL LOW (ref 36.0–46.0)
Hemoglobin: 9.2 g/dL — ABNORMAL LOW (ref 12.0–15.0)
Lymphocytes Relative: 13 %
Lymphs Abs: 0.9 10*3/uL (ref 0.7–4.0)
MCH: 31 pg (ref 26.0–34.0)
MCHC: 33 g/dL (ref 30.0–36.0)
MCV: 93.9 fL (ref 78.0–100.0)
Monocytes Absolute: 1.1 10*3/uL — ABNORMAL HIGH (ref 0.1–1.0)
Monocytes Relative: 15 %
Neutro Abs: 4.9 10*3/uL (ref 1.7–7.7)
Neutrophils Relative %: 70 %
Platelets: 49 10*3/uL — ABNORMAL LOW (ref 150–400)
RBC: 2.97 MIL/uL — ABNORMAL LOW (ref 3.87–5.11)
RDW: 23.4 % — ABNORMAL HIGH (ref 11.5–15.5)
WBC: 7 10*3/uL (ref 4.0–10.5)

## 2017-12-10 LAB — LACTATE DEHYDROGENASE: LDH: 590 U/L — ABNORMAL HIGH (ref 98–192)

## 2017-12-10 NOTE — Progress Notes (Signed)
  Speech Language Pathology Treatment: Dysphagia  Patient Details Name: Claire Salinas MRN: [id number] DOB: [birth year] Today's Date: 12/10/2017 Time: 9169-4503 SLP Time Calculation (min) (ACUTE ONLY): 8 min  Assessment / Plan / Recommendation Clinical Impression  Pt consumed thin liquids with no overt signs of aspiration or dysphagia. She declined any solid POs, to which she said that she liked her pureed diet as it was consistent with what she ate at home PTA. If this is consistent with pt's baseline diet, there are no additional acute SLP f/u needed for dysphagia, as she appears to be tolerating it. SLP to sign off - please reorder as needed.   HPI HPI: Pt. 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      SLP Plan  All goals met       Recommendations  Diet recommendations: Dysphagia 1 (puree);Thin liquid Liquids provided via: Cup;Straw Medication Administration: Crushed with puree Supervision: Staff to assist with self feeding;Full supervision/cueing for compensatory strategies Compensations: Slow rate;Small sips/bites;Minimize environmental distractions Postural Changes and/or Swallow Maneuvers: Seated upright 90 degrees                Oral Care Recommendations: Oral care BID Follow up Recommendations: None SLP Visit Diagnosis: Dysphagia, oral phase (R13.11) Plan: All goals met       GO                Germain Osgood 12/10/2017, 3:08 PM  Germain Osgood, M.A. CCC-SLP [phone]

## 2017-12-10 NOTE — Progress Notes (Signed)
Patient transferred to 5W 08 from 3MW. Alert to self and oriented to unit, shown how to use call bell. CIWA protocol in place without signs of withdrawal. Will continue to monitor and treat per MD orders.

## 2017-12-10 NOTE — Progress Notes (Signed)
Occupational Therapy Treatment Patient Details Name: Claire Salinas MRN: [id number] DOB: 11-Aug-1966 Today's Date: 12/10/2017    History of present illness Claire Salinas is a 52 y.o. female with Past medical history of hypothyroidism, DVT, bipolar disorder, alcohol; uses drugs. Drugs: Cocaine, Marijuana, Methamphetamines, and Amphetamines. She was transferred from [facility] to [facility] for concern of TTP.  She had been seen in [facility] ED 12/24 for right arm numbness and weakness.  Found to have UTI so prescribed Bactrim.  Returned 1/2 with worsening confusion.  Found to have anemia, thrombocytopenia, AKI. Overnight 1/3, hematology had concern that she needed transfer to ICU for hypotension and AMS.   OT comments  Pt in bed, sleeping upon arrival and didn't eat her lunch,  but easily woken and able to respond to simple stimuli such as yes/no questions. She continues to require +2 assist for supine - sit transfer, and requires continuous assistance at EOB ranging from Min-Max assist due to poor trunk control this session, loss of balance seated at EOB in unpredictable directions. Patient able to participate in some simple grooming and  there ex with OT/PT but ultimately was limited by fatigue requiring return to bed with +2 assist. Pt returned to supine in bed. OT will continue to follow acutely  Follow Up Recommendations  SNF;Supervision/Assistance - 24 hour    Equipment Recommendations  Other (comment)(TBD at next venue)    Recommendations for Other Services      Precautions / Restrictions Precautions Precautions: Fall Restrictions Weight Bearing Restrictions: No       Mobility Bed Mobility Overal bed mobility: Needs Assistance Bed Mobility: Supine to Sit;Sit to Supine     Supine to sit: Mod assist;+2 for physical assistance Sit to supine: Max assist;+2 for physical assistance   General bed mobility comments: assist x 2 for safety and efficency of transfer  Transfers                 General transfer comment: unsafe/unable to attempt due to poor trunk control     Balance Overall balance assessment: Needs assistance Sitting-balance support: Feet supported;Bilateral upper extremity supported Sitting balance-Leahy Scale: Poor Sitting balance - Comments: fluctuates between maxA to minA; able to sit EOB and occasionally maintain static sitting but in general losing balance in all directions, poor trunk strength and coordination, unpredictable in which direction she loses balance to  Postural control: Posterior lean;Right lateral lean                                 ADL either performed or assessed with clinical judgement   ADL Overall ADL's : Needs assistance/impaired     Grooming: Wash/dry hands;Wash/dry face;Sitting;Minimal assistance Grooming Details (indicate cue type and reason): min - mod A to mainatin dynamic sitting balance at EOB, verbal cues for thoroughness     Lower Body Bathing: Moderate assistance;Sitting/lateral leans Lower Body Bathing Details (indicate cue type and reason): simulated, assist to maintain safe                        General ADL Comments: pt sat EOB ~ 8 minutes with min  - mod A for balance/support     Vision Baseline Vision/History: Wears glasses Patient Visual Report: No change from baseline     Perception     Praxis      Cognition Arousal/Alertness: Lethargic;Awake/alert Behavior During Therapy: Flat affect Overall Cognitive Status: Difficult to  assess Area of Impairment: Attention;Following commands;Problem solving                   Current Attention Level: Focused   Following Commands: Follows one step commands with increased time;Follows one step commands inconsistently     Problem Solving: Slow processing;Decreased initiation;Difficulty sequencing;Requires verbal cues;Requires tactile cues General Comments: patient able to response to simple verbal stimuli such as  asking if she wanted food, or what she saw out the window (mostly yes or no questions)        Exercises     Shoulder Instructions       General Comments      Pertinent Vitals/ Pain       Pain Assessment: Faces Faces Pain Scale: Hurts even more Pain Location: patient states "all over my body"  Pain Descriptors / Indicators: Aching Pain Intervention(s): Limited activity within patient's tolerance;Monitored during session;Repositioned  Home Living                                          Prior Functioning/Environment              Frequency  Min 2X/week        Progress Toward Goals  OT Goals(current goals can now be found in the care plan section)  Progress towards OT goals: Progressing toward goals  Acute Rehab OT Goals Patient Stated Goal: unable to state  Plan Discharge plan remains appropriate    Co-evaluation    PT/OT/SLP Co-Evaluation/Treatment: Yes Reason for Co-Treatment: Complexity of the patient's impairments (multi-system involvement);For patient/therapist safety;To address functional/ADL transfers;Necessary to address cognition/behavior during functional activity PT goals addressed during session: Mobility/safety with mobility OT goals addressed during session: ADL's and self-care;Strengthening/ROM      AM-PAC PT "6 Clicks" Daily Activity     Outcome Measure   Help from another person eating meals?: Total Help from another person taking care of personal grooming?: A Lot Help from another person toileting, which includes using toliet, bedpan, or urinal?: Total Help from another person bathing (including washing, rinsing, drying)?: Total Help from another person to put on and taking off regular upper body clothing?: Total Help from another person to put on and taking off regular lower body clothing?: Total 6 Click Score: 7    End of Session Equipment Utilized During Treatment: Oxygen  OT Visit Diagnosis: Unsteadiness on feet  (R26.81);Other abnormalities of gait and mobility (R26.89);Muscle weakness (generalized) (M62.81);Other symptoms and signs involving cognitive function   Activity Tolerance Patient limited by lethargy   Patient Left in bed;with call bell/phone within reach;with bed alarm set   Nurse Communication Mobility status;Precautions    Functional Assessment Tool Used: AM-PAC 6 Clicks Daily Activity   Time: [id number] OT Time Calculation (min): 23 min  Charges: OT G-codes **NOT FOR INPATIENT CLASS** Functional Assessment Tool Used: AM-PAC 6 Clicks Daily Activity OT General Charges $OT Visit: 1 Visit OT Treatments $Therapeutic Activity: 8-22 mins     Galen ManilaSpencer, Claire Salinas 12/10/2017, 2:39 PM

## 2017-12-10 NOTE — Progress Notes (Signed)
Physical Therapy Treatment Patient Details Name: Claire Salinas MRN: [id number] DOB: [birth year] Today's Date: 12/10/2017    History of Present Illness Claire Salinas is a 52 y.o. female with Past medical history of hypothyroidism, DVT, bipolar disorder, alcohol; uses drugs. Drugs: Cocaine, Marijuana, Methamphetamines, and Amphetamines. She was transferred from [facility] to [facility] for concern of TTP.  She had been seen in [facility] ED 12/24 for right arm numbness and weakness.  Found to have UTI so prescribed Bactrim.  Returned 1/2 with worsening confusion.  Found to have anemia, thrombocytopenia, AKI. Overnight 1/3, hematology had concern that she needed transfer to ICU for hypotension and AMS.    PT Comments    Co-treat performed today with OT due to need for +2 for physical transfers and to address cognitive/behavioral aspects of treatment. Patient received in bed, sleeping but easily woken and able to respond to simple stimuli such as yes/no questions. She continues to require +2 assistance for supine to sit transfer, and requires continuous assistance at EOB ranging from Min-Max assist due to poor trunk control this session, loss of balance seated at EOB in unpredictable directions. Patient able to participate in some there ex with PT/OT but ultimately was limited by fatigue requiring return to bed with +2 assist. She was left in bed with all needs otherwise met, bed alarm activated.     Follow Up Recommendations  SNF;Supervision/Assistance - 24 hour     Equipment Recommendations  None recommended by PT    Recommendations for Other Services       Precautions / Restrictions Precautions Precautions: Fall Restrictions Weight Bearing Restrictions: No    Mobility  Bed Mobility Overal bed mobility: Needs Assistance Bed Mobility: Supine to Sit;Sit to Supine     Supine to sit: Mod assist;+2 for physical assistance Sit to supine: Max assist;+2 for physical assistance   General  bed mobility comments: assist x2 for safety and efficency of transfer  Transfers                 General transfer comment: unsafe/unable to attempt due to poor trunk control   Ambulation/Gait                 Stairs            Wheelchair Mobility    Modified Rankin (Stroke Patients Only)       Balance Overall balance assessment: Needs assistance Sitting-balance support: Feet supported;Bilateral upper extremity supported Sitting balance-Leahy Scale: Poor Sitting balance - Comments: fluctuates between maxA to minA; able to sit EOB and occasionally maintain static sitting but in general losing balance in all directions, poor trunk strength and coordination, unpredictable in which direction she loses balance to  Postural control: Posterior lean;Right lateral lean                                  Cognition Arousal/Alertness: Lethargic;Awake/alert(initially lethargic, able to wake and participate with PT following supine to sit transfer ) Behavior During Therapy: Flat affect Overall Cognitive Status: Difficult to assess Area of Impairment: Attention;Following commands;Problem solving                   Current Attention Level: Focused   Following Commands: Follows one step commands with increased time;Follows one step commands inconsistently     Problem Solving: Slow processing;Decreased initiation;Difficulty sequencing;Requires verbal cues;Requires tactile cues General Comments: patient able to response to simple verbal stimuli such  as asking if she wanted food, or what she saw out the window (mostly yes or no questions)      Exercises      General Comments        Pertinent Vitals/Pain Pain Assessment: Faces Faces Pain Scale: Hurts even more Pain Location: patient states "all over my body"  Pain Descriptors / Indicators: Aching Pain Intervention(s): Monitored during session;Limited activity within patient's tolerance    Home  Living                      Prior Function            PT Goals (current goals can now be found in the care plan section) Acute Rehab PT Goals Patient Stated Goal: unable to state PT Goal Formulation: Patient unable to participate in goal setting Time For Goal Achievement: 12/18/17 Potential to Achieve Goals: Fair Progress towards PT goals: Progressing toward goals    Frequency    Min 2X/week      PT Plan Current plan remains appropriate    Co-evaluation PT/OT/SLP Co-Evaluation/Treatment: Yes Reason for Co-Treatment: Complexity of the patient's impairments (multi-system involvement);For patient/therapist safety;To address functional/ADL transfers;Necessary to address cognition/behavior during functional activity PT goals addressed during session: Mobility/safety with mobility OT goals addressed during session: ADL's and self-care;Strengthening/ROM      AM-PAC PT "6 Clicks" Daily Activity  Outcome Measure  Difficulty turning over in bed (including adjusting bedclothes, sheets and blankets)?: Unable Difficulty moving from lying on back to sitting on the side of the bed? : Unable Difficulty sitting down on and standing up from a chair with arms (e.g., wheelchair, bedside commode, etc,.)?: Unable Help needed moving to and from a bed to chair (including a wheelchair)?: Total Help needed walking in hospital room?: Total Help needed climbing 3-5 steps with a railing? : Total 6 Click Score: 6    End of Session   Activity Tolerance: Patient limited by fatigue Patient left: in bed;with call bell/phone within reach;with bed alarm set   PT Visit Diagnosis: Other abnormalities of gait and mobility (R26.89);Muscle weakness (generalized) (M62.81)     Time: 7340-3709 PT Time Calculation (min) (ACUTE ONLY): 23 min  Charges:  $Therapeutic Activity: 8-22 mins                    G Codes:  Functional Assessment Tool Used: AM-PAC 6 Clicks Basic Mobility;Clinical judgement     [name] PT, DPT, CBIS  Supplemental Physical Therapist [name]   Pager [phone]

## 2017-12-10 NOTE — Progress Notes (Signed)
PROGRESS NOTE    Claire Salinas  WUJ:[id number] DOB: Jun 24, 1966 DOA: 12/03/2017 PCP: [name], MD   No chief complaint on file.   Brief Narrative:  Pt. 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 Currently further plan is continue current care.  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 TS 13 was negative -Thought to be drug-induced possibility as patient was on Bactrim and has history of substance abuse -Steroids were discontinued -Patient did have platelet transfusion -IR consulted for CT guided bone marrow biopsy- pending results  Acute metabolic encephalopathy -multifactorial? Versus unclear etiology -Possibly due to the above versus acute kidney injury versus delirium -CT head unremarkable -Patient with mild elevation of ammonia, placed on daily lactulose -MRI unremarkable and EEG should metabolic encephalopathy -[facility] neurology consulted, with no further workup at this time  Hypothyroidism  -secondary to noncompliance with Synthroid -continue levothyroxine  Alcohol abuse/polysubstance abuse -no evidence of withdrawal  Acute kidney injury -Resolved  Essential hypertension -Continue amlodipine  Elevated LFT/fatty liver/elevated ammonia level -Likely due to alcoholism -Continue lactulose -Hepatitis B negative  Physical deconditioning -PT/OT recommended SNF -Social work consulted, difficult to place  DVT Prophylaxis   SCDs  Code Status: Full  Family Communication: None at bedside  Disposition Plan: Admitted. Pending bone marrow biopsy results  Consultants Hematology Interventional radiology [facility] Nephrology   Procedures  CT-guided bone marrow biopsy HD catheter palcement EEG  Antibiotics   Anti-infectives (From admission, onward)   None      Subjective:   Claire Salinas seen and examined today.  Not very talkative today. Has no complaints, gave me the a thumbs up. Denies chest, pain, shortness of breath.    Objective:   Vitals:   12/09/17 2215 12/10/17 0500 12/10/17 0651 12/10/17 1328  BP: (!) 156/96  (!) 160/99 (!) 133/99  Pulse: 97  (!) 105 91  Resp: 17  16 17   Temp: (!) 97.5 F (36.4 C)  97.7 F (36.5 C) 98.4 F (36.9 C)  TempSrc: Oral  Oral Oral  SpO2: 98%  96% 98%  Weight:  67.5 kg (148 lb 13 oz)    Height:        Intake/Output Summary (Last 24 hours) at 12/10/2017 1435 Last data filed at 12/10/2017 0930 Gross per 24 hour  Intake 63 ml  Output 650 ml  Net -587 ml   Filed Weights   12/09/17 0426 12/09/17 1627 12/10/17 0500  Weight: 67.8 kg (149 lb 7.6 oz) 71.2 kg (156 lb 15.5 oz) 67.5 kg (148 lb 13 oz)    Exam  General: Well developed, well nourished, NAD, appears stated age  HEENT: NCAT, mucous membranes moist.   Cardiovascular: S1 S2 auscultated, SEM, RRR  Respiratory: Clear to auscultation bilaterally with equal chest rise  Abdomen: Soft, nontender, nondistended, + bowel sounds  Extremities: warm dry without cyanosis clubbing or edema  Neuro: AAOx3, decreased strength in all extremities  Psych: Appropriate   Data Reviewed: I have personally reviewed following labs and imaging studies  CBC: Recent Labs  Lab 12/03/17 1926  12/07/17 0556 12/08/17 0535 12/08/17 0830 12/08/17 1838 12/09/17 9562  12/10/17 0354  WBC 6.5   < > 7.4 7.2  --  7.8 7.6 7.0  NEUTROABS 5.5  --  5.0 5.2  --   --  6.0 4.9  HGB 9.7*   < > 8.0* 7.0* 7.1* 8.7* 8.6* 9.2*   HCT 28.6*   < > 24.3* 21.1* 21.5* 27.5* 26.4* 27.9*  MCV 97.3   < > 93.1 95.9  --  92.3 94.0 93.9  PLT 28*  28*   < > 8* 38*  --  30* 30* 49*   < > = values in this interval not displayed.   Basic Metabolic Panel: Recent Labs  Lab 12/03/17 1926  12/04/17 0500  12/05/17 0425 12/05/17 0906 12/07/17 0708 12/07/17 2030 12/08/17 0830 12/09/17 0506  NA 129*   < > 133*   < > 137 138 136 135 136 136  K 3.8   < > 3.7   < > 3.1* 3.1* 2.2* 2.9* 2.7* 3.6  CL 101   < > 101   < > 101 98* 95* 96* 97* 103  CO2 17*  --  23  --  27  --  31 28 30 26   GLUCOSE 118*   < > 109*   < > 105* 107* 78 79 86 81  BUN 48*   < > 44*   < > 34* 30* 19 19 14 9   CREATININE 2.73*   < > 2.31*   < > 1.44* 1.20* 0.71 0.68 0.69 0.63  CALCIUM 7.8*  --  8.2*  --  9.7  --  8.1* 8.0* 8.0* 8.0*  MG 1.6*  --  2.0  --   --   --  1.4* 1.8 1.7 1.7  PHOS 4.8*  --  4.0  --  3.0  --   --   --   --   --    < > = values in this interval not displayed.   GFR: Estimated Creatinine Clearance: 75.8 mL/min (by C-G formula based on SCr of 0.63 mg/dL). Liver Function Tests: Recent Labs  Lab 12/03/17 1926 12/04/17 0500 12/05/17 0425 12/07/17 0708 12/09/17 0506  AST 161*  --  67* 128* 72*  ALT 60*  --  31 54 49  ALKPHOS 78  --  91 133* 195*  BILITOT 3.3*  --  2.6* 5.1* 4.5*  PROT 3.8*  --  4.6* 4.1* 4.5*  ALBUMIN 2.1* 2.2* 2.6*  2.6* 2.4* 2.5*   No results for input(s): LIPASE, AMYLASE in the last 168 hours. No results for input(s): AMMONIA in the last 168 hours. Coagulation Profile: Recent Labs  Lab 12/03/17 1926 12/04/17 0500 12/07/17 0708 12/09/17 0506  INR 1.58 1.14 1.23 1.16   Cardiac Enzymes: Recent Labs  Lab 12/03/17 1926  CKTOTAL 686*   BNP (last 3 results) No results for input(s): PROBNP in the last 8760 hours. HbA1C: No results for input(s): HGBA1C in the last 72 hours. CBG: No results for input(s): GLUCAP in the last 168 hours. Lipid Profile: No results for input(s): CHOL, HDL, LDLCALC, TRIG,  CHOLHDL, LDLDIRECT in the last 72 hours. Thyroid Function Tests: No results for input(s): TSH, T4TOTAL, FREET4, T3FREE, THYROIDAB in the last 72 hours. Anemia Panel: No results for input(s): VITAMINB12, FOLATE, FERRITIN, TIBC, IRON, RETICCTPCT in the last 72 hours. Urine analysis:    Component Value Date/Time   COLORURINE YELLOW 12/03/2017 1837   APPEARANCEUR CLEAR 12/03/2017 1837   LABSPEC 1.006 12/03/2017 1837   PHURINE 5.0 12/03/2017 1837   GLUCOSEU  NEGATIVE 12/03/2017 1837   HGBUR SMALL (A) 12/03/2017 1837   BILIRUBINUR NEGATIVE 12/03/2017 1837   KETONESUR NEGATIVE 12/03/2017 1837   PROTEINUR NEGATIVE 12/03/2017 1837   NITRITE NEGATIVE 12/03/2017 1837   LEUKOCYTESUR NEGATIVE 12/03/2017 1837   Sepsis Labs: @LABRCNTIP (procalcitonin:4,lacticidven:4)  ) Recent Results (from the past 240 hour(s))  MRSA PCR Screening     Status: None   Collection Time: 12/03/17  5:33 PM  Result Value Ref Range Status   MRSA by PCR NEGATIVE NEGATIVE Final    Comment:        The GeneXpert MRSA Assay (FDA approved for NASAL specimens only), is one component of a comprehensive MRSA colonization surveillance program. It is not intended to diagnose MRSA infection nor to guide or monitor treatment for MRSA infections.   Culture, Urine     Status: None   Collection Time: 12/03/17  6:15 PM  Result Value Ref Range Status   Specimen Description URINE, RANDOM  Final   Special Requests NONE  Final   Culture NO GROWTH  Final   Report Status 12/04/2017 FINAL  Final  Culture, blood (routine x 2)     Status: None   Collection Time: 12/03/17  9:03 PM  Result Value Ref Range Status   Specimen Description BLOOD LEFT ANTECUBITAL  Final   Special Requests IN PEDIATRIC BOTTLE Blood Culture adequate volume  Final   Culture NO GROWTH 5 DAYS  Final   Report Status 12/08/2017 FINAL  Final  Culture, blood (routine x 2)     Status: None   Collection Time: 12/03/17  9:03 PM  Result Value Ref Range Status    Specimen Description BLOOD RIGHT HAND  Final   Special Requests IN PEDIATRIC BOTTLE Blood Culture adequate volume  Final   Culture NO GROWTH 5 DAYS  Final   Report Status 12/08/2017 FINAL  Final      Radiology Studies: Ct Bone Marrow Biopsy & Aspiration  Result Date: 12/09/2017 CLINICAL DATA:  Thrombocytopenia EXAM: CT GUIDED DEEP ILIAC BONE ASPIRATION AND CORE BIOPSY TECHNIQUE: Patient was placed supine on the CT gantry and limited axial scans through the pelvis were obtained. Appropriate skin entry site was identified. Skin site was marked, prepped with chlorhexidine, draped in usual sterile fashion, and infiltrated locally with 1% lidocaine. Intravenous Fentanyl and Versed were administered as conscious sedation during continuous monitoring of the patient's level of consciousness and physiological / cardiorespiratory status by the radiology RN, with a total moderate sedation time of 13 minutes. Under CT fluoroscopic guidance an 11-gauge Cook trocar bone needle was advanced into the right iliac bone just lateral to the sacroiliac joint. Once needle tip position was confirmed, core and aspiration samples were obtained, submitted to pathology for approval. Post procedure scans show no hematoma or fracture. Patient tolerated procedure well. COMPLICATIONS: COMPLICATIONS none IMPRESSION: 1. Technically successful CT guided right iliac bone core and aspiration biopsy. Electronically Signed   By: Lucrezia Europe M.D.   On: 12/09/2017 09:57     Scheduled Meds: . amLODipine  5 mg Oral Daily  . cloNIDine  0.1 mg Transdermal Q Sun  . folic acid  1 mg Oral Daily  . lactulose  20 g Oral Daily  . levothyroxine  112 mcg Oral QAC breakfast  . mouth rinse  15 mL Mouth Rinse BID  . multivitamin with minerals  1 tablet Oral Daily  . nicotine  14 mg Transdermal Daily  . oxymetazoline  1 spray Each Nare BID  . sodium chloride flush  3 mL Intravenous Q12H  . tamsulosin  0.4 mg Oral QPC supper  . thiamine  100 mg  Oral Daily   Continuous Infusions:   LOS: 7 days   Time Spent in minutes   40 minutes  Kaylia Winborne D.O. on 12/10/2017 at 2:35 PM  Between 7am to 7pm - Pager - [phone]  After 7pm go to [email or web address] - password [email or web address]  And look for the night coverage person covering for me after hours  [facility] Office  [phone]

## 2017-12-10 NOTE — Progress Notes (Addendum)
CSW continuing to work on placement for patient   Referal sent to:   [facility] [place]- they would accept for LTC but unwilling to accept for rehab  [facility] [place]  [facility]  Awaiting responses  Burna SisJenna H. Crestina Strike, LCSW Clinical Social Worker [phone]

## 2017-12-11 DIAGNOSIS — D649 Anemia, unspecified: Secondary | ICD-10-CM

## 2017-12-11 DIAGNOSIS — I10 Essential (primary) hypertension: Secondary | ICD-10-CM

## 2017-12-11 DIAGNOSIS — F1110 Opioid abuse, uncomplicated: Secondary | ICD-10-CM

## 2017-12-11 DIAGNOSIS — E871 Hypo-osmolality and hyponatremia: Secondary | ICD-10-CM

## 2017-12-11 DIAGNOSIS — F19121 Other psychoactive substance abuse with intoxication delirium: Secondary | ICD-10-CM

## 2017-12-11 DIAGNOSIS — R740 Nonspecific elevation of levels of transaminase and lactic acid dehydrogenase [LDH]: Secondary | ICD-10-CM

## 2017-12-11 LAB — FIBRINOGEN: Fibrinogen: 251 mg/dL (ref 210–475)

## 2017-12-11 LAB — CBC WITH DIFFERENTIAL/PLATELET
BASOS PCT: 0 %
Basophils Absolute: 0 10*3/uL (ref 0.0–0.1)
EOS PCT: 1 %
Eosinophils Absolute: 0.1 10*3/uL (ref 0.0–0.7)
HCT: 27.2 % — ABNORMAL LOW (ref 36.0–46.0)
HEMOGLOBIN: 9 g/dL — AB (ref 12.0–15.0)
LYMPHS PCT: 14 %
Lymphs Abs: 1.1 10*3/uL (ref 0.7–4.0)
MCH: 31 pg (ref 26.0–34.0)
MCHC: 33.1 g/dL (ref 30.0–36.0)
MCV: 93.8 fL (ref 78.0–100.0)
MONOS PCT: 10 %
Monocytes Absolute: 0.8 10*3/uL (ref 0.1–1.0)
NEUTROS ABS: 5.5 10*3/uL (ref 1.7–7.7)
NEUTROS PCT: 75 %
Platelets: 88 10*3/uL — ABNORMAL LOW (ref 150–400)
RBC: 2.9 MIL/uL — ABNORMAL LOW (ref 3.87–5.11)
RDW: 23 % — ABNORMAL HIGH (ref 11.5–15.5)
WBC: 7.5 10*3/uL (ref 4.0–10.5)

## 2017-12-11 LAB — COMPREHENSIVE METABOLIC PANEL
ALK PHOS: 192 U/L — AB (ref 38–126)
ALT: 55 U/L — AB (ref 14–54)
AST: 72 U/L — AB (ref 15–41)
Albumin: 3 g/dL — ABNORMAL LOW (ref 3.5–5.0)
Anion gap: 9 (ref 5–15)
BUN: 10 mg/dL (ref 6–20)
CALCIUM: 8.5 mg/dL — AB (ref 8.9–10.3)
CHLORIDE: 102 mmol/L (ref 101–111)
CO2: 23 mmol/L (ref 22–32)
CREATININE: 0.71 mg/dL (ref 0.44–1.00)
GFR calc Af Amer: >60 mL/min (ref >60)
Glucose, Bld: 71 mg/dL (ref 65–99)
Potassium: 3.8 mmol/L (ref 3.5–5.1)
Sodium: 134 mmol/L — ABNORMAL LOW (ref 135–145)
Total Bilirubin: 3 mg/dL — ABNORMAL HIGH (ref 0.3–1.2)
Total Protein: 4.9 g/dL — ABNORMAL LOW (ref 6.5–8.1)

## 2017-12-11 LAB — RETICULOCYTES
RBC.: 2.9 MIL/uL — ABNORMAL LOW (ref 3.87–5.11)
RETIC CT PCT: 11.5 % — AB (ref 0.4–3.1)
Retic Count, Absolute: 333.5 10*3/uL — ABNORMAL HIGH (ref 19.0–186.0)

## 2017-12-11 LAB — DIRECT ANTIGLOBULIN TEST (NOT AT ARMC)
DAT, COMPLEMENT: NEGATIVE
DAT, IGG: NEGATIVE

## 2017-12-11 NOTE — Progress Notes (Addendum)
4pm  CSW confirmed that [facility] can accept patient for LTC on Tuesday of next week- located at [address] [address]  CSW called medical director to update but there are no other options at this time. CSW called pt aunt to update.  10:30am CSW met with pt to discuss current option to transfer to LTC facility- pt is agreeable to LTC even if facility is further from [place], [place].  CSW also spoke with pt aunt Mariann Laster who is also in agreement to this plan  CSW left messages for [facility] in [place] who had offered LTC bed- awaiting response to confirm they can take patient and when  CSW continues to await response from [facility] and [facility] and [place], [place] Social Worker [phone]

## 2017-12-11 NOTE — Progress Notes (Signed)
Claire Salinas   DOB:1966/10/30   HE#:[id number]   JEH#:[id number]  Hematology follow up note   Subjective: Patient underwent a bone marrow biopsy 2 days ago.  She is much more awake, calm, and oriented, comparing to 4 days ago when I last saw her. Her thrombocytopenia has spontaneously improved.  No active bleeding.  Objective:  Vitals:   12/10/17 2230 12/11/17 0547  BP: (!) 153/99 137/84  Pulse: 96 98  Resp: 18 18  Temp: (!) 97.4 F (36.3 C) 98.7 F (37.1 C)  SpO2: 98% 97%    Body mass index is 26.05 kg/m.  Intake/Output Summary (Last 24 hours) at 12/11/2017 0828 Last data filed at 12/11/2017 0600 Gross per 24 hour  Intake 60 ml  Output 750 ml  Net -690 ml     Sclerae unicteric  Oropharynx clear  No peripheral adenopathy  Lungs clear -- no rales or rhonchi  Heart regular rate and rhythm  Abdomen benign  MSK no focal spinal tenderness, (+) anasarca in all extremities   Neuro disoriented, does not follow commands, moves all extremities   CBG (last 3)  No results for input(s): GLUCAP in the last 72 hours.   Labs:  CBC Latest Ref Rng & Units 12/11/2017 12/10/2017 12/09/2017  WBC 4.0 - 10.5 K/uL 7.5 7.0 7.6  Hemoglobin 12.0 - 15.0 g/dL 9.0(L) 9.2(L) 8.6(L)  Hematocrit 36.0 - 46.0 % 27.2(L) 27.9(L) 26.4(L)  Platelets 150 - 400 K/uL 88(L) 49(L) 30(L)    CMP Latest Ref Rng & Units 12/11/2017 12/09/2017 12/08/2017  Glucose 65 - 99 mg/dL 71 81 86  BUN 6 - 20 mg/dL 10 9 14   Creatinine 0.44 - 1.00 mg/dL 0.71 0.63 0.69  Sodium 135 - 145 mmol/L 134(L) 136 136  Potassium 3.5 - 5.1 mmol/L 3.8 3.6 2.7(LL)  Chloride 101 - 111 mmol/L 102 103 97(L)  CO2 22 - 32 mmol/L 23 26 30   Calcium 8.9 - 10.3 mg/dL 8.5(L) 8.0(L) 8.0(L)  Total Protein 6.5 - 8.1 g/dL 4.9(L) 4.5(L) -  Total Bilirubin 0.3 - 1.2 mg/dL 3.0(H) 4.5(H) -  Alkaline Phos 38 - 126 U/L 192(H) 195(H) -  AST 15 - 41 U/L 72(H) 72(H) -  ALT 14 - 54 U/L 55(H) 49 -    Urine Studies No results for input(s): UHGB, CRYS in the  last 72 hours.  Invalid input(s): UACOL, UAPR, USPG, UPH, UTP, UGL, [place], UBIL, UNIT, UROB, [place], UEPI, UWBC, [place], [place], [place], [place], [place]  Basic Metabolic Panel: Recent Labs  Lab 12/05/17 0425  12/07/17 0708 12/07/17 2030 12/08/17 0830 12/09/17 0506 12/11/17 0150  NA 137   < > 136 135 136 136 134*  K 3.1*   < > 2.2* 2.9* 2.7* 3.6 3.8  CL 101   < > 95* 96* 97* 103 102  CO2 27  --  31 28 30 26 23   GLUCOSE 105*   < > 78 79 86 81 71  BUN 34*   < > 19 19 14 9 10   CREATININE 1.44*   < > 0.71 0.68 0.69 0.63 0.71  CALCIUM 9.7  --  8.1* 8.0* 8.0* 8.0* 8.5*  MG  --   --  1.4* 1.8 1.7 1.7  --   PHOS 3.0  --   --   --   --   --   --    < > = values in this interval not displayed.   GFR Estimated Creatinine Clearance: 75.4 mL/min (by C-G formula based on SCr of 0.71  mg/dL). Liver Function Tests: Recent Labs  Lab 12/05/17 0425 12/07/17 0708 12/09/17 0506 12/11/17 0150  AST 67* 128* 72* 72*  ALT 31 54 49 55*  ALKPHOS 91 133* 195* 192*  BILITOT 2.6* 5.1* 4.5* 3.0*  PROT 4.6* 4.1* 4.5* 4.9*  ALBUMIN 2.6*  2.6* 2.4* 2.5* 3.0*   No results for input(s): LIPASE, AMYLASE in the last 168 hours. No results for input(s): AMMONIA in the last 168 hours. Coagulation profile Recent Labs  Lab 12/07/17 0708 12/09/17 0506  INR 1.23 1.16    CBC: Recent Labs  Lab 12/07/17 0556 12/08/17 0535 12/08/17 0830 12/08/17 1838 12/09/17 0506 12/10/17 0354 12/11/17 0150  WBC 7.4 7.2  --  7.8 7.6 7.0 7.5  NEUTROABS 5.0 5.2  --   --  6.0 4.9 5.5  HGB 8.0* 7.0* 7.1* 8.7* 8.6* 9.2* 9.0*  HCT 24.3* 21.1* 21.5* 27.5* 26.4* 27.9* 27.2*  MCV 93.1 95.9  --  92.3 94.0 93.9 93.8  PLT 8* 38*  --  30* 30* 49* 88*   Cardiac Enzymes: No results for input(s): CKTOTAL, CKMB, CKMBINDEX, TROPONINI in the last 168 hours. BNP: Invalid input(s): POCBNP CBG: No results for input(s): GLUCAP in the last 168 hours. D-Dimer No results for input(s): DDIMER in the last 72 hours. Hgb A1c No results for  input(s): HGBA1C in the last 72 hours. Lipid Profile No results for input(s): CHOL, HDL, LDLCALC, TRIG, CHOLHDL, LDLDIRECT in the last 72 hours. Thyroid function studies No results for input(s): TSH, T4TOTAL, T3FREE, THYROIDAB in the last 72 hours.  Invalid input(s): FREET3 Anemia work up Recent Labs    12/11/17 0150  RETICCTPCT 11.5*   Microbiology Recent Results (from the past 240 hour(s))  MRSA PCR Screening     Status: None   Collection Time: 12/03/17  5:33 PM  Result Value Ref Range Status   MRSA by PCR NEGATIVE NEGATIVE Final    Comment:        The GeneXpert MRSA Assay (FDA approved for NASAL specimens only), is one component of a comprehensive MRSA colonization surveillance program. It is not intended to diagnose MRSA infection nor to guide or monitor treatment for MRSA infections.   Culture, Urine     Status: None   Collection Time: 12/03/17  6:15 PM  Result Value Ref Range Status   Specimen Description URINE, RANDOM  Final   Special Requests NONE  Final   Culture NO GROWTH  Final   Report Status 12/04/2017 FINAL  Final  Culture, blood (routine x 2)     Status: None   Collection Time: 12/03/17  9:03 PM  Result Value Ref Range Status   Specimen Description BLOOD LEFT ANTECUBITAL  Final   Special Requests IN PEDIATRIC BOTTLE Blood Culture adequate volume  Final   Culture NO GROWTH 5 DAYS  Final   Report Status 12/08/2017 FINAL  Final  Culture, blood (routine x 2)     Status: None   Collection Time: 12/03/17  9:03 PM  Result Value Ref Range Status   Specimen Description BLOOD RIGHT HAND  Final   Special Requests IN PEDIATRIC BOTTLE Blood Culture adequate volume  Final   Culture NO GROWTH 5 DAYS  Final   Report Status 12/08/2017 FINAL  Final    Adamts 13 Activity >66.8 % 61.3 Abnormally low     pathology report  Diagnosis 12/09/2017 Bone Marrow, Aspirate,Biopsy, and Clot, right iliac - NORMOCELLULAR BONE MARROW FOR AGE WITH TRILINEAGE HEMATOPOIESIS. - SEE  COMMENT. PERIPHERAL BLOOD: -  NORMOCYTIC-NORMOCHROMIC ANEMIA. - NEUTROPHILIC LEFT SHIFT. - THROMBOCYTOPENIA. Diagnosis Note The bone marrow is normocellular for age with trilineage hematopoiesis and generally non-specific changes. The granulocytic series in particular displays scattering of mature neutrophilic cells with megaloblastoid changes and/or hypersegmentation likely related to nutritional deficiency, medication, alcohol, etc. The peripheral blood shows abnormal red cell morphology with numerous schistocytes and scattered microspherocytes suggestive of a peripheral hemolytic process. Correlation with cytogenetic studies is recommended.  Studies:  Ct Bone Marrow Biopsy & Aspiration  Result Date: 12/09/2017 CLINICAL DATA:  Thrombocytopenia EXAM: CT GUIDED DEEP ILIAC BONE ASPIRATION AND CORE BIOPSY TECHNIQUE: Patient was placed supine on the CT gantry and limited axial scans through the pelvis were obtained. Appropriate skin entry site was identified. Skin site was marked, prepped with chlorhexidine, draped in usual sterile fashion, and infiltrated locally with 1% lidocaine. Intravenous Fentanyl and Versed were administered as conscious sedation during continuous monitoring of the patient's level of consciousness and physiological / cardiorespiratory status by the radiology RN, with a total moderate sedation time of 13 minutes. Under CT fluoroscopic guidance an 11-gauge Cook trocar bone needle was advanced into the right iliac bone just lateral to the sacroiliac joint. Once needle tip position was confirmed, core and aspiration samples were obtained, submitted to pathology for approval. Post procedure scans show no hematoma or fracture. Patient tolerated procedure well. COMPLICATIONS: COMPLICATIONS none IMPRESSION: 1. Technically successful CT guided right iliac bone core and aspiration biopsy. Electronically Signed   By: Lucrezia Europe M.D.   On: 12/09/2017 09:57    Assessment: 52 y.o.  female, with  past medical history of bipolar, polysubstance abuse, hypothyroidism, DVT, was transferred from outside hospital for presumed TTP.  1. Severe thrombocytopenia and hemolytic anemia, secondary to medication (bactrim vs polysubstance abuse) and DIC, unlikely TTP  2. Acute metabolic encephalopathy, multifactorial, including TTP, AKI, polysubstance abuse, elevated amonia, and hypothyroidism, much improved  3.  AKI, new resolved now  4.  Polysubstance abuse, including alcohol, cocaine, marijuana, methadone 5. Hyponatremia, resolved  6.  Transaminitis, possibly related to alcohol and substance abuse 7. Coagulapathy (low fibrinogen, mildly elevated PT), DIC vs liver etiology, improving  8.  Transaminitis and elevated amonia, likely secondary to alcohol and polysubstance abuse 9. Hypertension    Recommendations: -Her bone marrow aspiration and biopsy was normal, no evidence of primary bone marrow disease. I have discussed with pt.  -I repeated reticular count, which has significant increased, LDH is still high, but less degree.  She still has low-grade hemolysis, hemoglobin has been stable over the past few days, Coombs' test was negative, no evidence of autoimmune hemolysis.  This is still likely secondary to DIC and medication  -Her thrombus cytopenia has improved significantly, likely will recover soon -Her mental status has much improved also. -I will sign off at this point, if her blood counts does not return to normal at discharge, I can set up a follow-up appointment with me in my clinic. -I talked to her aunt Mariann Laster and updated her.    Truitt Merle, MD 12/11/2017

## 2017-12-11 NOTE — Progress Notes (Signed)
PROGRESS NOTE    Claire Salinas  ZOX:[id number] DOB: 12-14-1965 DOA: 12/03/2017 PCP: [name], MD   No chief complaint on file.   Brief Narrative:  Pt. 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 Currently further plan is continue current care.  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 TS 13 was negative -Thought to be drug-induced possibility as patient was on Bactrim and has history of substance abuse -Steroids were discontinued -Patient did have platelet transfusion during this hospitalization -IR consulted for CT guided bone marrow biopsy- NORMOCELLULAR BONE MARROW FOR AGE WITH TRILINEAGE HEMATOPOIESIS -Currently hemoglobin 9, platelets 88 (improved spontaneously) -Continue to monitor CBC -Hematology, Dr. Burr Medico, will follow up with the patient as an outpatient.  Acute metabolic encephalopathy -multifactorial? Versus unclear etiology -Possibly due to the above versus acute kidney injury versus delirium -CT head unremarkable -Patient with mild elevation of ammonia, placed on daily lactulose -MRI unremarkable and EEG should metabolic encephalopathy -Psychiatry and neurology consulted, with no further workup at this time  Hypothyroidism  -secondary to noncompliance with Synthroid -continue levothyroxine  Alcohol abuse/polysubstance abuse -no evidence of withdrawal  Acute kidney injury -Resolved  Essential hypertension -Continue  amlodipine  Elevated LFT/fatty liver/elevated ammonia level -Likely due to alcoholism -Continue lactulose -Hepatitis B negative  Physical deconditioning -PT/OT recommended SNF -Social work consulted, difficult to place  DVT Prophylaxis  SCDs  Code Status: Full  Family Communication: None at bedside  Disposition Plan: Admitted. Possible discharge to SNF within the next 1-2 days  Consultants Hematology Interventional radiology PCCM Nephrology   Procedures  CT-guided bone marrow biopsy HD catheter palcement EEG  Antibiotics   Anti-infectives (From admission, onward)   None      Subjective:   Claire Salinas seen and examined today. Has no complaints today. States she is practicing and trying to move her arm more. Denies current chest pain, shortness of breath, abdominal pain, nausea, vomiting, diarrhea, constipation.   Objective:   Vitals:   12/10/17 2230 12/11/17 0504 12/11/17 0547 12/11/17 1334  BP: (!) 153/99  137/84 112/75  Pulse: 96  98 95  Resp: _0 Temp: (!) 97.4 F (36.3 C)  98.7 F (37.1 C) 98 F (36.7 C)  TempSrc: Oral  Oral Oral  SpO2: 98%  97% 98%  Weight:  66.7 kg (147 lb 0.8 oz)    Height:        Intake/Output Summary (Last 24 hours) at 12/11/2017 1338 Last data filed at 12/11/2017 0600 Gross per 24 hour  Intake -  Output 750 ml  Net -750 ml   Filed Weights   12/09/17 1627 12/10/17 0500 12/11/17 0504  Weight: 71.2 kg (156 lb 15.5 oz) 67.5 kg (148 lb 13 oz) 66.7 kg (147 lb 0.8 oz)    Exam  General: Well developed, well nourished, NAD, appears stated age  HEENT: NCAT, mucous membranes moist.   Cardiovascular: S1 S2 auscultated, +SEM, RRR  Respiratory: Clear to auscultation bilaterally, no wheezing   Abdomen: Soft, nontender, nondistended, + bowel sounds  Extremities: warm dry without cyanosis clubbing or edema  Neuro: AAOx3, decreased strength  in all extremities, moving RUE more  Psych: Appropriate mood and  affect   Data Reviewed: I have personally reviewed following labs and imaging studies  CBC: Recent Labs  Lab 12/07/17 0556 12/08/17 0535 12/08/17 0830 12/08/17 1838 12/09/17 0506 12/10/17 0354 12/11/17 0150  WBC 7.4 7.2  --  7.8 7.6 7.0 7.5  NEUTROABS 5.0 5.2  --   --  6.0 4.9 5.5  HGB 8.0* 7.0* 7.1* 8.7* 8.6* 9.2* 9.0*  HCT 24.3* 21.1* 21.5* 27.5* 26.4* 27.9* 27.2*  MCV 93.1 95.9  --  92.3 94.0 93.9 93.8  PLT 8* 38*  --  30* 30* 49* 88*   Basic Metabolic Panel: Recent Labs  Lab 12/05/17 0425  12/07/17 0708 12/07/17 2030 12/08/17 0830 12/09/17 0506 12/11/17 0150  NA 137   < > 136 135 136 136 134*  K 3.1*   < > 2.2* 2.9* 2.7* 3.6 3.8  CL 101   < > 95* 96* 97* 103 102  CO2 27  --  _0 GLUCOSE 105*   < > 78 79 86 81 71  BUN 34*   < > _1 CREATININE 1.44*   < > 0.71 0.68 0.69 0.63 0.71  CALCIUM 9.7  --  8.1* 8.0* 8.0* 8.0* 8.5*  MG  --   --  1.4* 1.8 1.7 1.7  --   PHOS 3.0  --   --   --   --   --   --    < > = values in this interval not displayed.   GFR: Estimated Creatinine Clearance: 75.4 mL/min (by C-G formula based on SCr of 0.71 mg/dL). Liver Function Tests: Recent Labs  Lab 12/05/17 0425 12/07/17 0708 12/09/17 0506 12/11/17 0150  AST 67* 128* 72* 72*  ALT 31 54 49 55*  ALKPHOS 91 133* 195* 192*  BILITOT 2.6* 5.1* 4.5* 3.0*  PROT 4.6* 4.1* 4.5* 4.9*  ALBUMIN 2.6*  2.6* 2.4* 2.5* 3.0*   No results for input(s): LIPASE, AMYLASE in the last 168 hours. No results for input(s): AMMONIA in the last 168 hours. Coagulation Profile: Recent Labs  Lab 12/07/17 0708 12/09/17 0506  INR 1.23 1.16   Cardiac Enzymes: No results for input(s): CKTOTAL, CKMB, CKMBINDEX, TROPONINI in the last 168 hours. BNP (last 3 results) No results for input(s): PROBNP in the last 8760 hours. HbA1C: No results for input(s): HGBA1C in the last 72 hours. CBG: No results for input(s): GLUCAP in the last 168 hours. Lipid Profile: No results for  input(s): CHOL, HDL, LDLCALC, TRIG, CHOLHDL, LDLDIRECT in the last 72 hours. Thyroid Function Tests: No results for input(s): TSH, T4TOTAL, FREET4, T3FREE, THYROIDAB in the last 72 hours. Anemia Panel: Recent Labs    12/11/17 0150  RETICCTPCT 11.5*   Urine analysis:    Component Value Date/Time   COLORURINE YELLOW 12/03/2017 1837   APPEARANCEUR CLEAR 12/03/2017 1837   LABSPEC 1.006 12/03/2017 1837   PHURINE 5.0 12/03/2017 1837   GLUCOSEU NEGATIVE 12/03/2017 1837   HGBUR SMALL (A) 12/03/2017 1837   BILIRUBINUR NEGATIVE 12/03/2017 1837   KETONESUR NEGATIVE 12/03/2017 1837   PROTEINUR NEGATIVE 12/03/2017 1837   NITRITE NEGATIVE 12/03/2017 1837   LEUKOCYTESUR NEGATIVE 12/03/2017 1837   Sepsis Labs: _2 (procalcitonin:4,lacticidven:4)  ) Recent Results (from the past 240 hour(s))  MRSA PCR Screening     Status: None   Collection Time: 12/03/17  5:33 PM  Result Value Ref Range Status   MRSA by PCR  NEGATIVE NEGATIVE Final    Comment:        The GeneXpert MRSA Assay (FDA approved for NASAL specimens only), is one component of a comprehensive MRSA colonization surveillance program. It is not intended to diagnose MRSA infection nor to guide or monitor treatment for MRSA infections.   Culture, Urine     Status: None   Collection Time: 12/03/17  6:15 PM  Result Value Ref Range Status   Specimen Description URINE, RANDOM  Final   Special Requests NONE  Final   Culture NO GROWTH  Final   Report Status 12/04/2017 FINAL  Final  Culture, blood (routine x 2)     Status: None   Collection Time: 12/03/17  9:03 PM  Result Value Ref Range Status   Specimen Description BLOOD LEFT ANTECUBITAL  Final   Special Requests IN PEDIATRIC BOTTLE Blood Culture adequate volume  Final   Culture NO GROWTH 5 DAYS  Final   Report Status 12/08/2017 FINAL  Final  Culture, blood (routine x 2)     Status: None   Collection Time: 12/03/17  9:03 PM  Result Value Ref Range Status   Specimen  Description BLOOD RIGHT HAND  Final   Special Requests IN PEDIATRIC BOTTLE Blood Culture adequate volume  Final   Culture NO GROWTH 5 DAYS  Final   Report Status 12/08/2017 FINAL  Final      Radiology Studies: No results found.   Scheduled Meds: . amLODipine  5 mg Oral Daily  . cloNIDine  0.1 mg Transdermal Q Sun  . folic acid  1 mg Oral Daily  . lactulose  20 g Oral Daily  . levothyroxine  112 mcg Oral QAC breakfast  . mouth rinse  15 mL Mouth Rinse BID  . multivitamin with minerals  1 tablet Oral Daily  . nicotine  14 mg Transdermal Daily  . oxymetazoline  1 spray Each Nare BID  . sodium chloride flush  3 mL Intravenous Q12H  . tamsulosin  0.4 mg Oral QPC supper  . thiamine  100 mg Oral Daily   Continuous Infusions:   LOS: 8 days   Time Spent in minutes   30 minutes  Claire Salinas D.O. on 12/11/2017 at 1:38 PM  Between 7am to 7pm - Pager - [phone]  After 7pm go to [email or web address] - password [email or web address]  And look for the night coverage person covering for me after hours  [facility] Office  [phone]

## 2017-12-12 LAB — MAGNESIUM: Magnesium: 1.7 mg/dL (ref 1.7–2.4)

## 2017-12-12 LAB — CBC WITH DIFFERENTIAL/PLATELET
Basophils Absolute: 0 10*3/uL (ref 0.0–0.1)
Basophils Relative: 0 %
EOS ABS: 0.1 10*3/uL (ref 0.0–0.7)
EOS PCT: 1 %
HEMATOCRIT: 28.4 % — AB (ref 36.0–46.0)
Hemoglobin: 9.2 g/dL — ABNORMAL LOW (ref 12.0–15.0)
LYMPHS ABS: 0.8 10*3/uL (ref 0.7–4.0)
Lymphocytes Relative: 15 %
MCH: 30.8 pg (ref 26.0–34.0)
MCHC: 32.4 g/dL (ref 30.0–36.0)
MCV: 95 fL (ref 78.0–100.0)
MONO ABS: 0.8 10*3/uL (ref 0.1–1.0)
Monocytes Relative: 15 %
Neutro Abs: 3.7 10*3/uL (ref 1.7–7.7)
Neutrophils Relative %: 69 %
PLATELETS: 144 10*3/uL — AB (ref 150–400)
RBC: 2.99 MIL/uL — ABNORMAL LOW (ref 3.87–5.11)
RDW: 22.7 % — AB (ref 11.5–15.5)
WBC: 5.4 10*3/uL (ref 4.0–10.5)

## 2017-12-12 MED ORDER — MAGNESIUM SULFATE 2 GM/50ML IV SOLN
2.0000 g | Freq: Once | INTRAVENOUS | Status: AC
  Administered 2017-12-12: 2 g via INTRAVENOUS
  Filled 2017-12-12: qty 50

## 2017-12-12 MED ORDER — METHOCARBAMOL 1000 MG/10ML IJ SOLN
500.0000 mg | Freq: Four times a day (QID) | INTRAMUSCULAR | Status: DC | PRN
  Administered 2017-12-12 – 2017-12-14 (×5): 500 mg via INTRAVENOUS
  Filled 2017-12-12 (×6): qty 5

## 2017-12-12 MED ORDER — POTASSIUM CHLORIDE CRYS ER 20 MEQ PO TBCR
40.0000 meq | EXTENDED_RELEASE_TABLET | Freq: Once | ORAL | Status: AC
  Administered 2017-12-12: 40 meq via ORAL
  Filled 2017-12-12: qty 2

## 2017-12-12 NOTE — Progress Notes (Signed)
PROGRESS NOTE    Claire Salinas  NZV:[id number] DOB: Feb 06, 1966 DOA: 12/03/2017 PCP: [name], MD   No chief complaint on file.   Brief Narrative:  Pt. 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 Currently further plan is continue current care.  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 TS 13 was negative -Thought to be drug-induced possibility as patient was on Bactrim and has history of substance abuse -Steroids were discontinued -Patient did have platelet transfusion during this hospitalization -IR consulted for CT guided bone marrow biopsy- NORMOCELLULAR BONE MARROW FOR AGE WITH TRILINEAGE HEMATOPOIESIS -counts continue to improve spontaneously, Currently hemoglobin 9.2, platelets 144 -Continue to monitor CBC -Hematology, Dr. Burr Medico, will follow up with the patient as an outpatient.  Leg cramps/myalgia  -Checked magnesium level- 1.7 (WNL, however will supplement, goal  >2) -potassium 3.8 (will supplement, goal 4) -will order robaxin  Acute metabolic encephalopathy -multifactorial? Versus unclear etiology -appears to have resolved -Possibly due to the above versus acute kidney injury versus delirium -CT head unremarkable -Patient with mild elevation of ammonia, placed on daily lactulose -MRI unremarkable and EEG should metabolic encephalopathy -Psychiatry and neurology consulted, with no further workup at this time  Hypothyroidism  -secondary  to noncompliance with Synthroid -continue levothyroxine  Alcohol abuse/polysubstance abuse -no evidence of withdrawal  Acute kidney injury -Resolved  Essential hypertension -Continue amlodipine  Elevated LFT/fatty liver/elevated ammonia level -Likely due to alcoholism -Continue lactulose -Hepatitis B negative  Physical deconditioning -PT/OT recommended SNF -Social work consulted, difficult to place  DVT Prophylaxis  SCDs  Code Status: Full  Family Communication: None at bedside  Disposition Plan: Admitted. Possible discharge to SNF on 12/15/2017  Consultants Hematology Interventional radiology PCCM Nephrology   Procedures  CT-guided bone marrow biopsy HD catheter palcement EEG  Antibiotics   Anti-infectives (From admission, onward)   None      Subjective:   Claire Salinas seen and examined today. Complains of leg pain and cramps. Denies chest pain, shortness of breath, abdominal pain, nausea, vomiting, diarrhea, constipation.   Objective:   Vitals:   12/11/17 2029 12/12/17 0130 12/12/17 0538 12/12/17 0814  BP: 123/90 129/78 122/80 (!) 118/91  Pulse: 92 65 97 92  Resp: 20 17 18 18   Temp: 97.8 F (36.6 C)  97.7 F (36.5 C) 97.7 F (36.5 C)  TempSrc: Oral  Oral Oral  SpO2: 99% 97% 99% 99%  Weight:   67.3 kg (148 lb 5.9 oz)   Height:        Intake/Output Summary (Last 24 hours) at 12/12/2017 1118 Last data filed at 12/12/2017 0910 Gross per 24 hour  Intake 320 ml  Output 650 ml  Net -330 ml   Filed Weights   12/10/17 0500 12/11/17 0504 12/12/17 0538  Weight: 67.5 kg (148 lb 13 oz) 66.7 kg (147 lb 0.8 oz) 67.3 kg (148 lb 5.9 oz)    Exam  General: Well developed, well nourished, NAD, appears stated age  HEENT: NCAT, mucous membranes moist.   Cardiovascular: S1 S2 auscultated, +SEM, RRR  Respiratory: Clear to auscultation bilaterally, no wheezing   Abdomen: Soft, nontender,  nondistended, + bowel sounds  Extremities: warm dry  without cyanosis clubbing or edema  Neuro: AAOx3, decreased strength but improving   Psych: Appropriate mood and affect, pleasant   Data Reviewed: I have personally reviewed following labs and imaging studies  CBC: Recent Labs  Lab 12/08/17 0535  12/08/17 1838 12/09/17 0506 12/10/17 0354 12/11/17 0150 12/12/17 0518  WBC 7.2  --  7.8 7.6 7.0 7.5 5.4  NEUTROABS 5.2  --   --  6.0 4.9 5.5 3.7  HGB 7.0*   < > 8.7* 8.6* 9.2* 9.0* 9.2*  HCT 21.1*   < > 27.5* 26.4* 27.9* 27.2* 28.4*  MCV 95.9  --  92.3 94.0 93.9 93.8 95.0  PLT 38*  --  30* 30* 49* 88* 144*   < > = values in this interval not displayed.   Basic Metabolic Panel: Recent Labs  Lab 12/07/17 0708 12/07/17 2030 12/08/17 0830 12/09/17 0506 12/11/17 0150 12/12/17 0518  NA 136 135 136 136 134*  --   K 2.2* 2.9* 2.7* 3.6 3.8  --   CL 95* 96* 97* 103 102  --   CO2 31 28 30 26 23   --   GLUCOSE 78 79 86 81 71  --   BUN 19 19 14 9 10   --   CREATININE 0.71 0.68 0.69 0.63 0.71  --   CALCIUM 8.1* 8.0* 8.0* 8.0* 8.5*  --   MG 1.4* 1.8 1.7 1.7  --  1.7   GFR: Estimated Creatinine Clearance: 75.8 mL/min (by C-G formula based on SCr of 0.71 mg/dL). Liver Function Tests: Recent Labs  Lab 12/07/17 0708 12/09/17 0506 12/11/17 0150  AST 128* 72* 72*  ALT 54 49 55*  ALKPHOS 133* 195* 192*  BILITOT 5.1* 4.5* 3.0*  PROT 4.1* 4.5* 4.9*  ALBUMIN 2.4* 2.5* 3.0*   No results for input(s): LIPASE, AMYLASE in the last 168 hours. No results for input(s): AMMONIA in the last 168 hours. Coagulation Profile: Recent Labs  Lab 12/07/17 0708 12/09/17 0506  INR 1.23 1.16   Cardiac Enzymes: No results for input(s): CKTOTAL, CKMB, CKMBINDEX, TROPONINI in the last 168 hours. BNP (last 3 results) No results for input(s): PROBNP in the last 8760 hours. HbA1C: No results for input(s): HGBA1C in the last 72 hours. CBG: No results for input(s): GLUCAP in the last 168 hours. Lipid Profile: No results for input(s): CHOL, HDL,  LDLCALC, TRIG, CHOLHDL, LDLDIRECT in the last 72 hours. Thyroid Function Tests: No results for input(s): TSH, T4TOTAL, FREET4, T3FREE, THYROIDAB in the last 72 hours. Anemia Panel: Recent Labs    12/11/17 0150  RETICCTPCT 11.5*   Urine analysis:    Component Value Date/Time   COLORURINE YELLOW 12/03/2017 1837   APPEARANCEUR CLEAR 12/03/2017 1837   LABSPEC 1.006 12/03/2017 1837   PHURINE 5.0 12/03/2017 1837   GLUCOSEU NEGATIVE 12/03/2017 1837   HGBUR SMALL (A) 12/03/2017 1837   BILIRUBINUR NEGATIVE 12/03/2017 1837   KETONESUR NEGATIVE 12/03/2017 1837   PROTEINUR NEGATIVE 12/03/2017 1837   NITRITE NEGATIVE 12/03/2017 1837   LEUKOCYTESUR NEGATIVE 12/03/2017 1837   Sepsis Labs: @LABRCNTIP (procalcitonin:4,lacticidven:4)  ) Recent Results (from the past 240 hour(s))  MRSA PCR Screening     Status: None   Collection Time: 12/03/17  5:33 PM  Result Value Ref Range Status   MRSA by PCR NEGATIVE NEGATIVE Final    Comment:        The GeneXpert MRSA Assay (FDA approved for NASAL specimens only), is one component of a comprehensive  MRSA colonization surveillance program. It is not intended to diagnose MRSA infection nor to guide or monitor treatment for MRSA infections.   Culture, Urine     Status: None   Collection Time: 12/03/17  6:15 PM  Result Value Ref Range Status   Specimen Description URINE, RANDOM  Final   Special Requests NONE  Final   Culture NO GROWTH  Final   Report Status 12/04/2017 FINAL  Final  Culture, blood (routine x 2)     Status: None   Collection Time: 12/03/17  9:03 PM  Result Value Ref Range Status   Specimen Description BLOOD LEFT ANTECUBITAL  Final   Special Requests IN PEDIATRIC BOTTLE Blood Culture adequate volume  Final   Culture NO GROWTH 5 DAYS  Final   Report Status 12/08/2017 FINAL  Final  Culture, blood (routine x 2)     Status: None   Collection Time: 12/03/17  9:03 PM  Result Value Ref Range Status   Specimen Description BLOOD RIGHT  HAND  Final   Special Requests IN PEDIATRIC BOTTLE Blood Culture adequate volume  Final   Culture NO GROWTH 5 DAYS  Final   Report Status 12/08/2017 FINAL  Final      Radiology Studies: No results found.   Scheduled Meds: . amLODipine  5 mg Oral Daily  . cloNIDine  0.1 mg Transdermal Q Sun  . folic acid  1 mg Oral Daily  . lactulose  20 g Oral Daily  . levothyroxine  112 mcg Oral QAC breakfast  . mouth rinse  15 mL Mouth Rinse BID  . multivitamin with minerals  1 tablet Oral Daily  . nicotine  14 mg Transdermal Daily  . oxymetazoline  1 spray Each Nare BID  . sodium chloride flush  3 mL Intravenous Q12H  . tamsulosin  0.4 mg Oral QPC supper  . thiamine  100 mg Oral Daily   Continuous Infusions: . methocarbamol (ROBAXIN)  IV 500 mg (12/12/17 1116)     LOS: 9 days   Time Spent in minutes   40 minutes  Ludean Duhart D.O. on 12/12/2017 at 11:18 AM  Between 7am to 7pm - Pager - [phone]  After 7pm go to [email or web address] - password [email or web address]  And look for the night coverage person covering for me after hours  [facility] Office  [phone]

## 2017-12-13 LAB — CBC WITH DIFFERENTIAL/PLATELET
BASOS PCT: 0 %
Basophils Absolute: 0 10*3/uL (ref 0.0–0.1)
EOS ABS: 0.1 10*3/uL (ref 0.0–0.7)
Eosinophils Relative: 1 %
HEMATOCRIT: 27.1 % — AB (ref 36.0–46.0)
HEMOGLOBIN: 8.7 g/dL — AB (ref 12.0–15.0)
LYMPHS PCT: 19 %
Lymphs Abs: 1 10*3/uL (ref 0.7–4.0)
MCH: 30 pg (ref 26.0–34.0)
MCHC: 32.1 g/dL (ref 30.0–36.0)
MCV: 93.4 fL (ref 78.0–100.0)
MONOS PCT: 12 %
Monocytes Absolute: 0.7 10*3/uL (ref 0.1–1.0)
Neutro Abs: 3.7 10*3/uL (ref 1.7–7.7)
Neutrophils Relative %: 68 %
Platelets: 222 10*3/uL (ref 150–400)
RBC: 2.9 MIL/uL — ABNORMAL LOW (ref 3.87–5.11)
RDW: 22.4 % — ABNORMAL HIGH (ref 11.5–15.5)
WBC: 5.5 10*3/uL (ref 4.0–10.5)

## 2017-12-13 NOTE — Progress Notes (Signed)
PROGRESS NOTE    Claire Salinas  SLH:[id number] DOB: 1966/09/25 DOA: 12/03/2017 PCP: [name], MD   No chief complaint on file.   Brief Narrative:  Pt. 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 Currently further plan is continue current care.  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 TS 13 was negative -Thought to be drug-induced possibility as patient was on Bactrim and has history of substance abuse -Steroids were discontinued -Patient did have platelet transfusion during this hospitalization -IR consulted for CT guided bone marrow biopsy- NORMOCELLULAR BONE MARROW FOR AGE WITH TRILINEAGE HEMATOPOIESIS -counts continue to improve spontaneously, platelets 222 (hemoglobin 8.7) -Continue to monitor CBC -Hematology, Dr. Burr Medico, will follow up with the patient as an outpatient.  Leg cramps/myalgia  -Checked magnesium level- 1.7 (WNL, however will supplement, goal  >2) -potassium 3.8 (will supplement, goal 4) -Continue Robaxin PRN  Acute metabolic encephalopathy -multifactorial? Versus unclear etiology -appears to have resolved -Possibly due to the above versus acute kidney injury versus delirium -CT head unremarkable -Patient with mild elevation of ammonia, placed on daily lactulose -MRI unremarkable and EEG should metabolic encephalopathy -Psychiatry and neurology consulted, with no further workup at this time  Hypothyroidism  -secondary to  noncompliance with Synthroid -continue levothyroxine  Alcohol abuse/polysubstance abuse -no evidence of withdrawal  Acute kidney injury -Resolved  Essential hypertension -Continue amlodipine  Elevated LFT/fatty liver/elevated ammonia level -Likely due to alcoholism -Continue lactulose -Hepatitis B negative  Physical deconditioning -PT/OT recommended SNF -Social work consulted, difficult to place  DVT Prophylaxis  SCDs  Code Status: Full  Family Communication: None at bedside  Disposition Plan: Admitted. Possible discharge to SNF on 12/15/2017  Consultants Hematology Interventional radiology PCCM Nephrology   Procedures  CT-guided bone marrow biopsy HD catheter palcement EEG  Antibiotics   Anti-infectives (From admission, onward)   None      Subjective:   Claire Salinas seen and examined today. Feels leg cramps have improved. Denies chest pain, shortness of breath, abdominal pain, nausea, vomiting, diarrhea, constipation.   Objective:   Vitals:   12/12/17 2158 12/13/17 0500 12/13/17 0601 12/13/17 1021  BP: 130/90  96/79 96/83  Pulse: 87  100   Resp: 20  18   Temp: 98 F (36.7 C)  98.4 F (36.9 C)   TempSrc: Oral  Oral   SpO2: 98%  94% 98%  Weight:  66.3 kg (146 lb 2.6 oz)    Height:        Intake/Output Summary (Last 24 hours) at 12/13/2017 1259 Last data filed at 12/13/2017 0300 Gross per 24 hour  Intake 230 ml  Output 325 ml  Net -95 ml   Filed Weights   12/11/17 0504 12/12/17 0538 12/13/17 0500  Weight: 66.7 kg (147 lb 0.8 oz) 67.3 kg (148 lb 5.9 oz) 66.3 kg (146 lb 2.6 oz)    Exam (no change from exam on 12/12/2017)  General: Well developed, well nourished, NAD, appears stated age  HEENT: NCAT, mucous membranes moist.   Cardiovascular: S1 S2 auscultated, +SEM, RRR  Respiratory: Clear to auscultation bilaterally, no wheezing   Abdomen: Soft, nontender,  nondistended, + bowel sounds  Extremities: warm dry without cyanosis  clubbing or edema  Neuro: AAOx3, decreased strength but improving   Psych: Appropriate mood and affect, pleasant   Data Reviewed: I have personally reviewed following labs and imaging studies  CBC: Recent Labs  Lab 12/09/17 0506 12/10/17 0354 12/11/17 0150 12/12/17 0518 12/13/17 0257  WBC 7.6 7.0 7.5 5.4 5.5  NEUTROABS 6.0 4.9 5.5 3.7 3.7  HGB 8.6* 9.2* 9.0* 9.2* 8.7*  HCT 26.4* 27.9* 27.2* 28.4* 27.1*  MCV 94.0 93.9 93.8 95.0 93.4  PLT 30* 49* 88* 144* 158   Basic Metabolic Panel: Recent Labs  Lab 12/07/17 0708 12/07/17 2030 12/08/17 0830 12/09/17 0506 12/11/17 0150 12/12/17 0518  NA 136 135 136 136 134*  --   K 2.2* 2.9* 2.7* 3.6 3.8  --   CL 95* 96* 97* 103 102  --   CO2 _0 --   GLUCOSE 78 79 86 81 71  --   BUN _1 --   CREATININE 0.71 0.68 0.69 0.63 0.71  --   CALCIUM 8.1* 8.0* 8.0* 8.0* 8.5*  --   MG 1.4* 1.8 1.7 1.7  --  1.7   GFR: Estimated Creatinine Clearance: 75.3 mL/min (by C-G formula based on SCr of 0.71 mg/dL). Liver Function Tests: Recent Labs  Lab 12/07/17 0708 12/09/17 0506 12/11/17 0150  AST 128* 72* 72*  ALT 54 49 55*  ALKPHOS 133* 195* 192*  BILITOT 5.1* 4.5* 3.0*  PROT 4.1* 4.5* 4.9*  ALBUMIN 2.4* 2.5* 3.0*   No results for input(s): LIPASE, AMYLASE in the last 168 hours. No results for input(s): AMMONIA in the last 168 hours. Coagulation Profile: Recent Labs  Lab 12/07/17 0708 12/09/17 0506  INR 1.23 1.16   Cardiac Enzymes: No results for input(s): CKTOTAL, CKMB, CKMBINDEX, TROPONINI in the last 168 hours. BNP (last 3 results) No results for input(s): PROBNP in the last 8760 hours. HbA1C: No results for input(s): HGBA1C in the last 72 hours. CBG: No results for input(s): GLUCAP in the last 168 hours. Lipid Profile: No results for input(s): CHOL, HDL, LDLCALC, TRIG, CHOLHDL, LDLDIRECT in the last 72 hours. Thyroid Function Tests: No results for input(s): TSH, T4TOTAL, FREET4, T3FREE, THYROIDAB  in the last 72 hours. Anemia Panel: Recent Labs    12/11/17 0150  RETICCTPCT 11.5*   Urine analysis:    Component Value Date/Time   COLORURINE YELLOW 12/03/2017 1837   APPEARANCEUR CLEAR 12/03/2017 1837   LABSPEC 1.006 12/03/2017 1837   PHURINE 5.0 12/03/2017 1837   GLUCOSEU NEGATIVE 12/03/2017 1837   HGBUR SMALL (A) 12/03/2017 1837   BILIRUBINUR NEGATIVE 12/03/2017 1837   KETONESUR NEGATIVE 12/03/2017 1837   PROTEINUR NEGATIVE 12/03/2017 1837   NITRITE NEGATIVE 12/03/2017 1837   LEUKOCYTESUR NEGATIVE 12/03/2017 1837   Sepsis Labs: _2 (procalcitonin:4,lacticidven:4)  ) Recent Results (from the past 240 hour(s))  MRSA PCR Screening     Status: None   Collection Time: 12/03/17  5:33 PM  Result Value Ref Range Status   MRSA by PCR NEGATIVE NEGATIVE Final    Comment:        The GeneXpert MRSA Assay (FDA approved for NASAL specimens only), is one component of a comprehensive MRSA colonization surveillance program. It is not intended to diagnose MRSA infection nor to guide or monitor treatment for MRSA infections.   Culture, Urine     Status: None   Collection Time: 12/03/17  6:15 PM  Result Value  Ref Range Status   Specimen Description URINE, RANDOM  Final   Special Requests NONE  Final   Culture NO GROWTH  Final   Report Status 12/04/2017 FINAL  Final  Culture, blood (routine x 2)     Status: None   Collection Time: 12/03/17  9:03 PM  Result Value Ref Range Status   Specimen Description BLOOD LEFT ANTECUBITAL  Final   Special Requests IN PEDIATRIC BOTTLE Blood Culture adequate volume  Final   Culture NO GROWTH 5 DAYS  Final   Report Status 12/08/2017 FINAL  Final  Culture, blood (routine x 2)     Status: None   Collection Time: 12/03/17  9:03 PM  Result Value Ref Range Status   Specimen Description BLOOD RIGHT HAND  Final   Special Requests IN PEDIATRIC BOTTLE Blood Culture adequate volume  Final   Culture NO GROWTH 5 DAYS  Final   Report Status  12/08/2017 FINAL  Final      Radiology Studies: No results found.   Scheduled Meds: . amLODipine  5 mg Oral Daily  . cloNIDine  0.1 mg Transdermal Q Sun  . folic acid  1 mg Oral Daily  . lactulose  20 g Oral Daily  . levothyroxine  112 mcg Oral QAC breakfast  . mouth rinse  15 mL Mouth Rinse BID  . multivitamin with minerals  1 tablet Oral Daily  . nicotine  14 mg Transdermal Daily  . oxymetazoline  1 spray Each Nare BID  . sodium chloride flush  3 mL Intravenous Q12H  . tamsulosin  0.4 mg Oral QPC supper  . thiamine  100 mg Oral Daily   Continuous Infusions: . methocarbamol (ROBAXIN)  IV Stopped (12/13/17 0600)     LOS: 10 days   Time Spent in minutes   40 minutes  Nikeria Kalman D.O. on 12/13/2017 at 12:59 PM  Between 7am to 7pm - Pager - [phone]  After 7pm go to [email or web address] - password [email or web address]  And look for the night coverage person covering for me after hours  [facility] Office  [phone]

## 2017-12-14 LAB — CBC WITH DIFFERENTIAL/PLATELET
BASOS ABS: 0 10*3/uL (ref 0.0–0.1)
Basophils Relative: 0 %
EOS ABS: 0 10*3/uL (ref 0.0–0.7)
Eosinophils Relative: 0 %
HCT: 24.9 % — ABNORMAL LOW (ref 36.0–46.0)
Hemoglobin: 8.1 g/dL — ABNORMAL LOW (ref 12.0–15.0)
LYMPHS PCT: 23 %
Lymphs Abs: 0.9 10*3/uL (ref 0.7–4.0)
MCH: 30.7 pg (ref 26.0–34.0)
MCHC: 32.5 g/dL (ref 30.0–36.0)
MCV: 94.3 fL (ref 78.0–100.0)
MONO ABS: 0.5 10*3/uL (ref 0.1–1.0)
Monocytes Relative: 14 %
NEUTROS PCT: 63 %
Neutro Abs: 2.3 10*3/uL (ref 1.7–7.7)
PLATELETS: 205 10*3/uL (ref 150–400)
RBC: 2.64 MIL/uL — AB (ref 3.87–5.11)
RDW: 22.3 % — AB (ref 11.5–15.5)
WBC: 3.7 10*3/uL — AB (ref 4.0–10.5)

## 2017-12-14 NOTE — Progress Notes (Signed)
Physical Therapy Treatment Patient Details Name: Claire Salinas MRN: [id number] DOB: 1966/03/08 Today's Date: 12/14/2017    History of Present Illness Claire Salinas is a 52 y.o. female with Past medical history of hypothyroidism, DVT, bipolar disorder, alcohol; uses drugs. Drugs: Cocaine, Marijuana, Methamphetamines, and Amphetamines. She was transferred from [facility] to [facility] for concern of TTP.  She had been seen in [facility] ED 12/24 for right arm numbness and weakness.  Found to have UTI so prescribed Bactrim.  Returned 1/2 with worsening confusion.  Found to have anemia, thrombocytopenia, AKI. Overnight 1/3, hematology had concern that she needed transfer to ICU for hypotension and AMS.    PT Comments    Pt progresses towards PT goals, tolerating squat pivot transfer from EOB to chair with maxA +2. Pt continues to demonstrate cognitive impairments, following one step commands inconsistently and requiring verbal and tactile cueing for sequencing with movement. Current discharge plan remains appropriate. PT will follow acutely while in hospital setting in order to maximize functional mobility.    Follow Up Recommendations  SNF;Supervision/Assistance - 24 hour     Equipment Recommendations  None recommended by PT    Recommendations for Other Services       Precautions / Restrictions Precautions Precautions: Fall Restrictions Weight Bearing Restrictions: No    Mobility  Bed Mobility Overal bed mobility: Needs Assistance Bed Mobility: Rolling;Sidelying to Sit Rolling: Mod assist Sidelying to sit: HOB elevated;Mod assist;+2 for physical assistance       General bed mobility comments: Pt requiring VCs, TCs and physical assist to bring legs towards the EOB and reach for railing in rolling. Pt also requiring VCs to push up from bed during sidelying to sit. Once sitting EOB, pt requiring hands on assist constantly to maintain sitting balance.   Transfers Overall transfer  level: Needs assistance   Transfers: Squat Pivot Transfers     Squat pivot transfers: Max assist;+2 physical assistance     General transfer comment: SPT x1 from EOB to chair. Once in chair, pt requiring constant guarding due to lack of trunk control.   Ambulation/Gait                 Stairs            Wheelchair Mobility    Modified Rankin (Stroke Patients Only)       Balance Overall balance assessment: Needs assistance Sitting-balance support: Feet supported;No upper extremity supported   Sitting balance - Comments: Pt unable to maintain static sitting balance without external support and physical assist. Pt over-corrects when given commands to shift in any given direction.  Postural control: Right lateral lean;Posterior lean                                  Cognition Arousal/Alertness: Awake/alert Behavior During Therapy: WFL for tasks assessed/performed   Area of Impairment: Attention;Following commands;Problem solving                   Current Attention Level: Focused   Following Commands: Follows one step commands inconsistently     Problem Solving: Decreased initiation;Slow processing;Difficulty sequencing;Requires verbal cues;Requires tactile cues General Comments: Pt gives hugs to therapists without prompting and reaches into therapists pocket while pt seated EOB and therpaist aiding in sitting balance. Pt following commands ~ 50-75% of the time, but requiring multimodal cueing for sequencing and demonstrates slow processing time. Pt asks if she is going home  today, however very unlikely.      Exercises Other Exercises Other Exercises: BUE elevation to 90 degrees x5.    General Comments General comments (skin integrity, edema, etc.): Pt in chair post-session with 5 pillows for positioning.       Pertinent Vitals/Pain Pain Assessment: Faces Faces Pain Scale: No hurt    Home Living                      Prior  Function            PT Goals (current goals can now be found in the care plan section) Progress towards PT goals: Progressing toward goals    Frequency    Min 2X/week      PT Plan Current plan remains appropriate    Co-evaluation              AM-PAC PT "6 Clicks" Daily Activity  Outcome Measure  Difficulty turning over in bed (including adjusting bedclothes, sheets and blankets)?: Unable Difficulty moving from lying on back to sitting on the side of the bed? : Unable Difficulty sitting down on and standing up from a chair with arms (e.g., wheelchair, bedside commode, etc,.)?: Unable Help needed moving to and from a bed to chair (including a wheelchair)?: A Lot Help needed walking in hospital room?: Total Help needed climbing 3-5 steps with a railing? : Total 6 Click Score: 7    End of Session Equipment Utilized During Treatment: Gait belt Activity Tolerance: Patient tolerated treatment well Patient left: with call bell/phone within reach;with chair alarm set;in chair Nurse Communication: Mobility status;Need for lift equipment PT Visit Diagnosis: Other abnormalities of gait and mobility (R26.89);Muscle weakness (generalized) (M62.81)     Time: [id number] PT Time Calculation (min) (ACUTE ONLY): 30 min  Charges:  $Therapeutic Activity: 23-37 mins                    G Codes:       Reina Fuserin Seaira Byus, SPT   Reina Fuserin Shaunae Sieloff 12/14/2017, 1:46 PM

## 2017-12-14 NOTE — Progress Notes (Signed)
PROGRESS NOTE    Claire Salinas  ZLD:[id number] DOB: 01/06/1966 DOA: 12/03/2017 PCP: [name], MD   No chief complaint on file.   Brief Narrative:  Pt. 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 Currently further plan is continue current care.  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 TS 13 was negative -Thought to be drug-induced possibility as patient was on Bactrim and has history of substance abuse -Steroids were discontinued -Patient did have platelet transfusion during this hospitalization -IR consulted for CT guided bone marrow biopsy- NORMOCELLULAR BONE MARROW FOR AGE WITH TRILINEAGE HEMATOPOIESIS -counts continue to improve spontaneously -Continue to monitor CBC -Hematology, Dr. Burr Medico, will follow up with the patient as an outpatient.  Leg cramps/myalgia  -Checked magnesium level- 1.7 (WNL, however will supplement, goal  >2) -potassium 3.8 (will supplement, goal 4) -will continue to monitor BMP and magnesium levels -Continue Robaxin PRN  Acute metabolic encephalopathy -multifactorial? Versus unclear etiology -appears to have resolved -Possibly due to the above versus acute kidney injury versus delirium -CT head unremarkable -Patient with mild elevation of ammonia, placed on daily lactulose -MRI unremarkable and EEG should metabolic encephalopathy -Psychiatry and neurology consulted, with no further workup at this time  Hypothyroidism    -secondary to noncompliance with Synthroid -continue levothyroxine  Alcohol abuse/polysubstance abuse -no evidence of withdrawal  Acute kidney injury -Resolved  Essential hypertension -Continue amlodipine  Elevated LFT/fatty liver/elevated ammonia level -Likely due to alcoholism -Continue lactulose -Hepatitis B negative  Physical deconditioning -PT/OT recommended SNF -Social work consulted, difficult to place- possibly 12/15/2017  DVT Prophylaxis  SCDs  Code Status: Full  Family Communication: None at bedside  Disposition Plan: Admitted. Possible discharge to SNF on 12/15/2017  Consultants Hematology Interventional radiology PCCM Nephrology   Procedures  CT-guided bone marrow biopsy HD catheter palcement EEG  Antibiotics   Anti-infectives (From admission, onward)   None      Subjective:   Clent Demark seen and examined today. Feels her leg cramps have improved. Currently has no complaints. Denies chest pain, shortness of breath, abdominal pain, nausea, vomiting, diarrhea, constipation.   Objective:   Vitals:   12/13/17 1340 12/13/17 2128 12/14/17 0700 12/14/17 1003  BP: 107/60 (!) 145/88 (!) 157/106 (!) 148/113  Pulse: 81 (!) 102 (!) 102   Resp: 14 17 19    Temp: 98.4 F (36.9 C) (!) 97.5 F (36.4 C) (!) 97.5 F (36.4 C)   TempSrc: Oral Oral Oral   SpO2: 96% 100% 100%   Weight:      Height:        Intake/Output Summary (Last 24 hours) at 12/14/2017 1040 Last data filed at 12/14/2017 0600 Gross per 24 hour  Intake 1380 ml  Output -  Net 1380 ml   Filed Weights   12/11/17 0504 12/12/17 0538 12/13/17 0500  Weight: 66.7 kg (147 lb 0.8 oz) 67.3 kg (148 lb 5.9 oz) 66.3 kg (146 lb 2.6 oz)   Exam  General: Well developed, well nourished, NAD  HEENT: NCAT, mucous membranes moist.   Cardiovascular: S1 S2 auscultated, RRR, +SEM  Respiratory: Clear to auscultation bilaterally with  equal chest rise  Abdomen: Soft, nontender, nondistended,  + bowel sounds  Extremities: warm dry without cyanosis clubbing or edema  Neuro: AAOx3,decreased strength, but improving, otherwise no new deficits  Psych: Appropriate and pleasant   Data Reviewed: I have personally reviewed following labs and imaging studies  CBC: Recent Labs  Lab 12/10/17 0354 12/11/17 0150 12/12/17 0518 12/13/17 0257 12/14/17 0247  WBC 7.0 7.5 5.4 5.5 3.7*  NEUTROABS 4.9 5.5 3.7 3.7 2.3  HGB 9.2* 9.0* 9.2* 8.7* 8.1*  HCT 27.9* 27.2* 28.4* 27.1* 24.9*  MCV 93.9 93.8 95.0 93.4 94.3  PLT 49* 88* 144* 222 185   Basic Metabolic Panel: Recent Labs  Lab 12/07/17 2030 12/08/17 0830 12/09/17 0506 12/11/17 0150 12/12/17 0518  NA 135 136 136 134*  --   K 2.9* 2.7* 3.6 3.8  --   CL 96* 97* 103 102  --   CO2 28 30 26 23   --   GLUCOSE 79 86 81 71  --   BUN 19 14 9 10   --   CREATININE 0.68 0.69 0.63 0.71  --   CALCIUM 8.0* 8.0* 8.0* 8.5*  --   MG 1.8 1.7 1.7  --  1.7   GFR: Estimated Creatinine Clearance: 75.3 mL/min (by C-G formula based on SCr of 0.71 mg/dL). Liver Function Tests: Recent Labs  Lab 12/09/17 0506 12/11/17 0150  AST 72* 72*  ALT 49 55*  ALKPHOS 195* 192*  BILITOT 4.5* 3.0*  PROT 4.5* 4.9*  ALBUMIN 2.5* 3.0*   No results for input(s): LIPASE, AMYLASE in the last 168 hours. No results for input(s): AMMONIA in the last 168 hours. Coagulation Profile: Recent Labs  Lab 12/09/17 0506  INR 1.16   Cardiac Enzymes: No results for input(s): CKTOTAL, CKMB, CKMBINDEX, TROPONINI in the last 168 hours. BNP (last 3 results) No results for input(s): PROBNP in the last 8760 hours. HbA1C: No results for input(s): HGBA1C in the last 72 hours. CBG: No results for input(s): GLUCAP in the last 168 hours. Lipid Profile: No results for input(s): CHOL, HDL, LDLCALC, TRIG, CHOLHDL, LDLDIRECT in the last 72 hours. Thyroid Function Tests: No results for input(s): TSH, T4TOTAL, FREET4, T3FREE, THYROIDAB in the last 72 hours. Anemia Panel: No  results for input(s): VITAMINB12, FOLATE, FERRITIN, TIBC, IRON, RETICCTPCT in the last 72 hours. Urine analysis:    Component Value Date/Time   COLORURINE YELLOW 12/03/2017 1837   APPEARANCEUR CLEAR 12/03/2017 1837   LABSPEC 1.006 12/03/2017 1837   PHURINE 5.0 12/03/2017 1837   GLUCOSEU NEGATIVE 12/03/2017 1837   HGBUR SMALL (A) 12/03/2017 1837   BILIRUBINUR NEGATIVE 12/03/2017 1837   KETONESUR NEGATIVE 12/03/2017 1837   PROTEINUR NEGATIVE 12/03/2017 1837   NITRITE NEGATIVE 12/03/2017 1837   LEUKOCYTESUR NEGATIVE 12/03/2017 1837   Sepsis Labs: @LABRCNTIP (procalcitonin:4,lacticidven:4)  ) No results found for this or any previous visit (from the past 240 hour(s)).    Radiology Studies: No results found.   Scheduled Meds: . amLODipine  5 mg Oral Daily  . cloNIDine  0.1 mg Transdermal Q Sun  . folic acid  1 mg Oral Daily  . lactulose  20 g Oral Daily  . levothyroxine  112 mcg Oral QAC breakfast  . mouth rinse  15 mL Mouth Rinse BID  . multivitamin with minerals  1 tablet Oral Daily  . nicotine  14 mg Transdermal Daily  . oxymetazoline  1 spray Each Nare BID  . sodium chloride flush  3 mL Intravenous Q12H  . tamsulosin  0.4  mg Oral QPC supper  . thiamine  100 mg Oral Daily   Continuous Infusions: . methocarbamol (ROBAXIN)  IV Stopped (12/14/17 0521)     LOS: 11 days   Time Spent in minutes   30 minutes  Cornelius Marullo D.O. on 12/14/2017 at 10:40 AM  Between 7am to 7pm - Pager - [phone]  After 7pm go to [email or web address] - password [email or web address]  And look for the night coverage person covering for me after hours  [facility] Office  [phone]

## 2017-12-15 LAB — CBC WITH DIFFERENTIAL/PLATELET
Basophils Absolute: 0 10*3/uL (ref 0.0–0.1)
Basophils Relative: 0 %
EOS PCT: 1 %
Eosinophils Absolute: 0 10*3/uL (ref 0.0–0.7)
HEMATOCRIT: 28.2 % — AB (ref 36.0–46.0)
HEMOGLOBIN: 9.2 g/dL — AB (ref 12.0–15.0)
LYMPHS PCT: 27 %
Lymphs Abs: 0.8 10*3/uL (ref 0.7–4.0)
MCH: 30.8 pg (ref 26.0–34.0)
MCHC: 32.6 g/dL (ref 30.0–36.0)
MCV: 94.3 fL (ref 78.0–100.0)
Monocytes Absolute: 0.6 10*3/uL (ref 0.1–1.0)
Monocytes Relative: 18 %
NEUTROS ABS: 1.7 10*3/uL (ref 1.7–7.7)
Neutrophils Relative %: 54 %
Platelets: 292 10*3/uL (ref 150–400)
RBC: 2.99 MIL/uL — ABNORMAL LOW (ref 3.87–5.11)
RDW: 21.9 % — ABNORMAL HIGH (ref 11.5–15.5)
WBC: 3.1 10*3/uL — ABNORMAL LOW (ref 4.0–10.5)

## 2017-12-15 LAB — BASIC METABOLIC PANEL
Anion gap: 12 (ref 5–15)
CHLORIDE: 102 mmol/L (ref 101–111)
CO2: 21 mmol/L — ABNORMAL LOW (ref 22–32)
Calcium: 8.5 mg/dL — ABNORMAL LOW (ref 8.9–10.3)
Creatinine, Ser: 0.67 mg/dL (ref 0.44–1.00)
GFR calc Af Amer: >60 mL/min (ref >60)
GFR calc non Af Amer: >60 mL/min (ref >60)
GLUCOSE: 76 mg/dL (ref 65–99)
POTASSIUM: 3.4 mmol/L — AB (ref 3.5–5.1)
Sodium: 135 mmol/L (ref 135–145)

## 2017-12-15 LAB — MAGNESIUM: Magnesium: 1.7 mg/dL (ref 1.7–2.4)

## 2017-12-15 MED ORDER — MAGNESIUM SULFATE 2 GM/50ML IV SOLN
2.0000 g | Freq: Once | INTRAVENOUS | Status: AC
  Administered 2017-12-15: 2 g via INTRAVENOUS
  Filled 2017-12-15: qty 50

## 2017-12-15 MED ORDER — AMLODIPINE BESYLATE 5 MG PO TABS: 5.0000 mg | ORAL_TABLET | Freq: Every day | ORAL | Status: AC

## 2017-12-15 MED ORDER — CLONIDINE 0.1 MG/24HR TD PTWK: 0.1000 mg | MEDICATED_PATCH | TRANSDERMAL | 12 refills | Status: AC

## 2017-12-15 MED ORDER — FOLIC ACID 1 MG PO TABS: 1.0000 mg | ORAL_TABLET | Freq: Every day | ORAL | Status: AC

## 2017-12-15 MED ORDER — TAMSULOSIN HCL 0.4 MG PO CAPS: 0.4000 mg | ORAL_CAPSULE | Freq: Every day | ORAL | Status: AC

## 2017-12-15 MED ORDER — MAGNESIUM OXIDE 400 MG PO TABS: 400.0000 mg | ORAL_TABLET | Freq: Every day | ORAL | Status: AC

## 2017-12-15 MED ORDER — POTASSIUM CHLORIDE CRYS ER 20 MEQ PO TBCR
40.0000 meq | EXTENDED_RELEASE_TABLET | Freq: Once | ORAL | Status: AC
  Administered 2017-12-15: 40 meq via ORAL
  Filled 2017-12-15: qty 2

## 2017-12-15 MED ORDER — NICOTINE 14 MG/24HR TD PT24: 14.0000 mg | MEDICATED_PATCH | Freq: Every day | TRANSDERMAL | 0 refills | Status: AC

## 2017-12-15 MED ORDER — ADULT MULTIVITAMIN W/MINERALS CH: 1.0000 | ORAL_TABLET | Freq: Every day | ORAL | Status: AC

## 2017-12-15 MED ORDER — LACTULOSE 10 GM/15ML PO SOLN: 20.0000 g | Freq: Every day | ORAL | 0 refills | Status: AC

## 2017-12-15 MED ORDER — HYDROCODONE-ACETAMINOPHEN 5-325 MG PO TABS: 1.0000 | ORAL_TABLET | Freq: Four times a day (QID) | ORAL | 0 refills | Status: AC | PRN

## 2017-12-15 MED ORDER — THIAMINE HCL 100 MG PO TABS: 100.0000 mg | ORAL_TABLET | Freq: Every day | ORAL | Status: AC

## 2017-12-15 NOTE — Progress Notes (Signed)
[facility] Anticipated DC date: 12/15/17 Family notified: Aunt Transport by: Sharin MonsPTAR 12:30   Per MD [facility]. RN, patient, patient's family, and facility notified of DC. Discharge Summary sent to facility. RN given number for report [phone]). DC packet on chart. Ambulance transport requested for patient.   CSW signing off.  Cristobal GoldmannNadia Caeli Linehan, [place] Clinical Social Worker [phone]

## 2017-12-15 NOTE — Clinical Social Work Placement (Signed)
   CLINICAL SOCIAL WORK PLACEMENT  NOTE  Date:  12/15/2017  Patient Details  Name: Claire Salinas MRN: [id number] Date of Birth: 04/09/1966  Clinical Social Work is seeking post-discharge placement for this patient at the Skilled  Nursing Facility level of care (*CSW will initial, date and re-position this form in  chart as items are completed):  Yes   Patient/family provided with [facility] Clinical Social Work Department's list of facilities offering this level of care within the geographic area requested by the patient (or if unable, by the patient's family).  Yes   Patient/family informed of their freedom to choose among providers that offer the needed level of care, that participate in Medicare, Medicaid or managed care program needed by the patient, have an available bed and are willing to accept the patient.  Yes   Patient/family informed of [facility]'s ownership interest in [facility] and [facility], as well as of the fact that they are under no obligation to receive care at these facilities.  PASRR submitted to EDS on       PASRR number received on       Existing PASRR number confirmed on       FL2 transmitted to all facilities in geographic area requested by pt/family on       FL2 transmitted to all facilities within larger geographic area on       Patient informed that his/her managed care company has contracts with or will negotiate with certain facilities, including the following:        Yes   Patient/family informed of bed offers received.  Patient chooses bed at [facility] [place]     Physician recommends and patient chooses bed at      Patient to [facility] [place].  Patient to be transferred to facility by PTAR     Patient family notified on 12/15/17 of transfer.  Name of family member notified:  Aunt     PHYSICIAN       Additional Comment:     _______________________________________________ Mearl LatinNadia S Ariauna Farabee, LCSWA 12/15/2017, 11:49 AM

## 2017-12-15 NOTE — Discharge Summary (Addendum)
Physician Discharge Summary  Claire Salinas IWL:[id number] DOB: 1966-02-09 DOA: 12/03/2017  PCP: [name], MD  Admit date: 12/03/2017 Discharge date: 12/15/2017  Time spent: 45 minutes  Recommendations for Outpatient Follow-up:  Patient will be discharged to [place], continue physical and occupational therapy.  Patient will need to follow up with primary care provider within one week of discharge, repeat CBC, BMP, magnesium. Follow up with Dr. Burr Medico, hematologist, in 1-2 weeks.  Patient should continue medications as prescribed.  Patient should follow a dysphagia 1 diet.   Discharge Diagnoses:  DIC/TTP ruled out/ Drug Induced thromobocytopenia/Anemia Leg cramps/myalgia  Acute metabolic encephalopathy Hypothyroidism  Alcohol abuse/polysubstance abuse Acute kidney injury Essential hypertension Elevated LFT/fatty liver/elevated ammonia level Physical deconditioning  Discharge Condition: Stable  Diet recommendation: dysphagia 1  Filed Weights   12/12/17 0538 12/13/17 0500 12/15/17 0552  Weight: 67.3 kg (148 lb 5.9 oz) 66.3 kg (146 lb 2.6 oz) 65.7 kg (144 lb 13.5 oz)    History of present illness:  On 12/03/2017 by Dr. Sherilyn Cooter is a 52 y.o. female with Past medical history of hypothyroidism, DVT, bipolar disorder.   Patient initially presented with [facility] ER as she was unable to urinate for last 2 days as well as unable to walk and very weak and confused at home.  Her presentation date was December 02, 2017.  Also has nausea on arrival there.  Recently seen in November 23, 2017 for At which time treated with Bactrim and UTI. Smokes 1 pack a day.  Drinks beer or wine and liquor, 8 shortness of vodka daily.  History of polysubstance abuse including marijuana, cocaine, meth, methadone.  Also reported easy bruising that started recently. Home medication includes Seroquel 200 mg daily, Neurontin 100 mg 3 times daily, levothyroxine 112  MCG, diclofenac gel and potassium.   Platelets on admission was 114,000, repeat was 55,000 with schistocytes on smear and therefore the patient was sent to [facility]. Renal Ultrasound Shows No Evidence of Hydronephrosis Bilaterally, Left Kidney 7.7 on the right kidney 8.3. Chest x-ray was performed after right IJ line placement no acute abnormality. CT abdomen and pelvis with contrast November 23, 2017 shows fatty liver  Hospital Course:  DIC/TTP ruled out/ Drug Induced thromobocytopenia/Anemia -Transferred to [facility] for concern of TTP HUS-like syndrome -Hematology consulted and appreciated -Both fibrinogen, elevated INR, elevated d-dimer with worsening thrombocytopenia, schistocytes noted on smear -Patient was started on plasmapheresis and completed 3 cycles however discontinued as ED a.m. TS 13 was negative -Thought to be drug-induced possibility as patient was on Bactrim and has history of substance abuse -Steroids were discontinued -Patient did have platelet transfusion during this hospitalization -IR consulted for CT guided bone marrow biopsy- NORMOCELLULAR BONE MARROW FOR AGE WITH TRILINEAGE HEMATOPOIESIS -counts continue to improve spontaneously -hemoglobin 9.2, platelets 292 -Hematology, Dr. Burr Medico, will follow up with the patient as an outpatient. -Repeat CBC in one week  Leg cramps/myalgia  -Continue potassium supplementation  -Continue Robaxin PRN -will place on mag ox -Repeat BMP and magnesium level in one week  Acute metabolic encephalopathy -multifactorial? Versus unclear etiology -appears to have resolved -Possibly due to the above versus acute kidney injury versus delirium -CT head unremarkable -Patient with mild elevation of ammonia, placed on daily lactulose -MRI unremarkable and EEG should metabolic encephalopathy -Psychiatry and neurology consulted, with no further workup at this time -patient supposedly on seroquel prior to admission, however this  was held during hospitalization, may restart  on discharge- patient should follow up with outpatient psychiatry   Hypothyroidism  -secondary to noncompliance with Synthroid -continue levothyroxine  Alcohol abuse/polysubstance abuse -no evidence of withdrawal  Acute kidney injury -Resolved  Acute urinary retention  -Continue Flomax -failed voiding trial, will discharge with foley catheter- may need follow up with urology   Essential hypertension -Continue amlodipine  Elevated LFT/fatty liver/elevated ammonia level -Likely due to alcoholism -was placed on lactulose -Hepatitis B negative  Physical deconditioning -PT/OT recommended SNF -Social work consulted, difficult to place- possibly 12/15/2017  Consultants Hematology Interventional radiology PCCM Nephrology   Procedures  CT-guided bone marrow biopsy HD catheter palcement EEG  Discharge Exam: Vitals:   12/15/17 0552 12/15/17 1019  BP: (!) 119/94 117/85  Pulse: 99   Resp: 18   Temp: 98.1 F (36.7 C)   SpO2: 98%    Patient has no complaints today. Denies chest pain, shortness breath, abdominal pain, nausea vomiting, diarrhea constipation. Feels her leg cramps have improved.   General: Well developed, well nourished, NAD  HEENT: NCAT, mucous membranes moist.  Cardiovascular: S1 S2 auscultated, RRR, +SEM  Respiratory: Clear to auscultation bilaterally with equal chest rise  Abdomen: Soft, nontender, nondistended, + bowel sounds  Extremities: warm dry without cyanosis clubbing or edema  Neuro: AAOx3, decreased strength however improving. No new focal deficits  Skin: Without rashes exudates or nodules  Psych: appropriate mood and affect, pleasant  Discharge Instructions Discharge Instructions    Discharge instructions   Complete by:  As directed    Patient will be discharged to [place], continue physical and occupational therapy.  Patient will need to follow up with primary  care provider within one week of discharge, repeat CBC, BMP, magnesium. Follow up with Dr. Burr Medico, hematologist, in 1-2 weeks.  Patient should continue medications as prescribed.  Patient should follow a dysphagia 1 diet.     Allergies as of 12/15/2017   No Known Allergies     Medication List    TAKE these medications   amLODipine 5 MG tablet Commonly known as:  NORVASC Take 1 tablet (5 mg total) by mouth daily. Start taking on:  12/16/2017   cloNIDine 0.1 mg/24hr patch Commonly known as:  CATAPRES - Dosed in mg/24 hr Place 1 patch (0.1 mg total) onto the skin every Sunday. Start taking on:  0/01/4916   folic acid 1 MG tablet Commonly known as:  FOLVITE Take 1 tablet (1 mg total) by mouth daily. Start taking on:  12/16/2017   HYDROcodone-acetaminophen 5-325 MG tablet Commonly known as:  NORCO/VICODIN Take 1-2 tablets by mouth every 6 (six) hours as needed for moderate pain or severe pain.   lactulose 10 GM/15ML solution Commonly known as:  CHRONULAC Take 30 mLs (20 g total) by mouth daily. Start taking on:  12/16/2017   levothyroxine 112 MCG tablet Commonly known as:  SYNTHROID, LEVOTHROID Take 112 mcg by mouth daily before breakfast.   magnesium oxide 400 MG tablet Commonly known as:  MAG-OX Take 1 tablet (400 mg total) by mouth daily.   multivitamin with minerals Tabs tablet Take 1 tablet by mouth daily. Start taking on:  12/16/2017   nicotine 14 mg/24hr patch Commonly known as:  NICODERM CQ - dosed in mg/24 hours Place 1 patch (14 mg total) onto the skin daily. Start taking on:  12/16/2017   tamsulosin 0.4 MG Caps capsule Commonly known as:  FLOMAX Take 1 capsule (0.4 mg total) by mouth daily after supper.   thiamine 100 MG tablet  Take 1 tablet (100 mg total) by mouth daily. Start taking on:  12/16/2017      No Known Allergies Follow-up Information    The [employer] Violence Hotline. Call.   Why:  call if in need of support or resources Contact  information: [place]. Call.   Why:  Domestic Violence Shelter- call if you are in need of emergency shelter. Contact information: Phone Main: [phone]       [name], MD. Schedule an appointment as soon as possible for a visit in 1 week(s).   Specialty:  Family Medicine Why:  Hospital follow up Contact information: [place] [address] [phone]        Truitt Merle, MD. Schedule an appointment as soon as possible for a visit in 2 week(s).   Specialties:  Hematology, Oncology Why:  Hospital follow up Contact information: [place] [address] [phone]            The results of significant diagnostics from this hospitalization (including imaging, microbiology, ancillary and laboratory) are listed below for reference.    Significant Diagnostic Studies: Ct Head Wo Contrast  Result Date: 12/03/2017 CLINICAL DATA:  Initial evaluation for acute altered mental status. EXAM: CT HEAD WITHOUT CONTRAST TECHNIQUE: Contiguous axial images were obtained from the base of the skull through the vertex without intravenous contrast. COMPARISON:  None available. FINDINGS: Brain: Cerebral volume within normal limits for patient age. Mild chronic small vessel ischemic changes within the cerebral white matter. No evidence for acute intracranial hemorrhage. No findings to suggest acute large vessel territory infarct. No mass lesion, midline shift, or mass effect. Ventricles are normal in size without evidence for hydrocephalus. No extra-axial fluid collection identified. Vascular: No hyperdense vessel identified. Skull: Scalp soft tissues demonstrate no acute abnormality.Calvarium intact. Soft tissue calcification noted at the left frontal scalp near the vertex, of doubtful significance. Sinuses/Orbits: Globes and orbital soft tissues are within normal limits. Visualized paranasal sinuses are clear. No mastoid  effusion. IMPRESSION: 1. No acute intracranial abnormality. 2. Mild chronic microvascular ischemic disease. Electronically Signed   By: Jeannine Boga M.D.   On: 12/03/2017 23:22   Mr Brain Wo Contrast  Result Date: 12/04/2017 CLINICAL DATA:  52 y/o F; generalized weakness, TTP, acute kidney injury, acute encephalopathy. EXAM: MRI HEAD WITHOUT CONTRAST TECHNIQUE: Multiplanar, multiecho pulse sequences of the brain and surrounding structures were obtained without intravenous contrast. COMPARISON:  12/03/2017 CT head. FINDINGS: Brain: No acute infarction, hemorrhage, hydrocephalus, extra-axial collection or mass lesion. Few nonspecific foci of T2 FLAIR hyperintense signal abnormality in subcortical and periventricular white matter are compatible with mild chronic microvascular ischemic changes for age. Mild brain parenchymal volume loss. Vascular: Normal flow voids. Skull and upper cervical spine: Normal marrow signal. Sinuses/Orbits: Negative. Other: None. IMPRESSION: 1. No acute intracranial abnormality identified. 2. Mild chronic microvascular ischemic changes and mild parenchymal volume loss of the brain. Electronically Signed   By: Kristine Garbe M.D.   On: 12/04/2017 22:03   Dg Chest Port 1 View  Result Date: 12/03/2017 CLINICAL DATA:  Shortness of breath EXAM: PORTABLE CHEST 1 VIEW COMPARISON:  None. FINDINGS: A right central venous catheter is in place with tip projected over the right clavicular head. The catheter is somewhat laterally positioned but this is likely due to patient rotation. Tip is probably in the upper SVC region. Prominent right paratracheal soft tissues. This is likely vascular but lymphadenopathy is  a less likely consideration. No pneumothorax. No blunting of costophrenic angles. No focal consolidation. Linear atelectasis or fibrosis in the left lung base. Heart size and pulmonary vascularity are normal. IMPRESSION: Right central venous catheter tip is in the upper SVC  region. Prominent right paratracheal soft tissues are likely vascular. Lymphadenopathy is a less likely consideration. Linear atelectasis or fibrosis in the left lung base. Electronically Signed   By: Lucienne Capers M.D.   On: 12/03/2017 20:58   Dg Chest Port 1v Same Day  Result Date: 12/03/2017 CLINICAL DATA:  Central line insertion.  Site infection. EXAM: PORTABLE CHEST 1 VIEW COMPARISON:  12/03/2017 FINDINGS: Since the previous study, there is interval placement of a left central venous catheter with tip over the low SVC region. No pneumothorax. An indwelling right central venous catheter is unchanged in position with tip over the upper SVC region. Shallow inspiration with linear atelectasis in the lung bases. Heart size and pulmonary vascularity are normal. Prominent right paratracheal soft tissue is likely vascular and may be related to patient rotation. IMPRESSION: Left central venous catheter with tip over the low SVC region. Right central venous catheter with tip over the upper SVC region. Shallow inspiration with atelectasis in lung bases. Electronically Signed   By: Lucienne Capers M.D.   On: 12/03/2017 22:52   Ct Bone Marrow Biopsy & Aspiration  Result Date: 12/09/2017 CLINICAL DATA:  Thrombocytopenia EXAM: CT GUIDED DEEP ILIAC BONE ASPIRATION AND CORE BIOPSY TECHNIQUE: Patient was placed supine on the CT gantry and limited axial scans through the pelvis were obtained. Appropriate skin entry site was identified. Skin site was marked, prepped with chlorhexidine, draped in usual sterile fashion, and infiltrated locally with 1% lidocaine. Intravenous Fentanyl and Versed were administered as conscious sedation during continuous monitoring of the patient's level of consciousness and physiological / cardiorespiratory status by the radiology RN, with a total moderate sedation time of 13 minutes. Under CT fluoroscopic guidance an 11-gauge Cook trocar bone needle was advanced into the right iliac bone  just lateral to the sacroiliac joint. Once needle tip position was confirmed, core and aspiration samples were obtained, submitted to pathology for approval. Post procedure scans show no hematoma or fracture. Patient tolerated procedure well. COMPLICATIONS: COMPLICATIONS none IMPRESSION: 1. Technically successful CT guided right iliac bone core and aspiration biopsy. Electronically Signed   By: Lucrezia Europe M.D.   On: 12/09/2017 09:57    Microbiology: No results found for this or any previous visit (from the past 240 hour(s)).   Labs: Basic Metabolic Panel: Recent Labs  Lab 12/09/17 0506 12/11/17 0150 12/12/17 0518 12/15/17 0249  NA 136 134*  --  135  K 3.6 3.8  --  3.4*  CL 103 102  --  102  CO2 26 23  --  21*  GLUCOSE 81 71  --  76  BUN 9 10  --  <5*  CREATININE 0.63 0.71  --  0.67  CALCIUM 8.0* 8.5*  --  8.5*  MG 1.7  --  1.7 1.7   Liver Function Tests: Recent Labs  Lab 12/09/17 0506 12/11/17 0150  AST 72* 72*  ALT 49 55*  ALKPHOS 195* 192*  BILITOT 4.5* 3.0*  PROT 4.5* 4.9*  ALBUMIN 2.5* 3.0*   No results for input(s): LIPASE, AMYLASE in the last 168 hours. No results for input(s): AMMONIA in the last 168 hours. CBC: Recent Labs  Lab 12/11/17 0150 12/12/17 0518 12/13/17 0257 12/14/17 0247 12/15/17 0249  WBC 7.5 5.4 5.5  3.7* 3.1*  NEUTROABS 5.5 3.7 3.7 2.3 1.7  HGB 9.0* 9.2* 8.7* 8.1* 9.2*  HCT 27.2* 28.4* 27.1* 24.9* 28.2*  MCV 93.8 95.0 93.4 94.3 94.3  PLT 88* 144* 222 205 292   Cardiac Enzymes: No results for input(s): CKTOTAL, CKMB, CKMBINDEX, TROPONINI in the last 168 hours. BNP: BNP (last 3 results) Recent Labs    12/03/17 1830  BNP 235.0*    ProBNP (last 3 results) No results for input(s): PROBNP in the last 8760 hours.  CBG: No results for input(s): GLUCAP in the last 168 hours.     Signed:  Cristal Ford  [facility] 12/15/2017, 11:42 AM

## 2017-12-15 NOTE — Progress Notes (Signed)
Occupational Therapy Treatment Patient Details Name: Claire Salinas MRN: [id number] DOB: 1966/11/09 Today's Date: 12/15/2017    History of present illness Claire Salinas is a 52 y.o. female with Past medical history of hypothyroidism, DVT, bipolar disorder, alcohol; uses drugs. Drugs: Cocaine, Marijuana, Methamphetamines, and Amphetamines. She was transferred from [facility] to [facility] for concern of TTP.  She had been seen in [facility] ED 12/24 for right arm numbness and weakness.  Found to have UTI so prescribed Bactrim.  Returned 1/2 with worsening confusion.  Found to have anemia, thrombocytopenia, AKI. Overnight 1/3, hematology had concern that she needed transfer to ICU for hypotension and AMS.   OT comments  Pt making progress toward goals. Continue to recommend rehab at [facility].  Follow Up Recommendations  SNF;Supervision/Assistance - 24 hour    Equipment Recommendations  Other (comment)(TBA)    Recommendations for Other Services      Precautions / Restrictions Precautions Precautions: Fall       Mobility Bed Mobility Overal bed mobility: Needs Assistance       Supine to sit: Max assist        Transfers Overall transfer level: Needs assistance   Transfers: Sit to/from Stand Sit to Stand: Max assist;+2 physical assistance(unable to extend legs in standing)              Balance Overall balance assessment: Needs assistance   Sitting balance-Leahy Scale: Poor       Standing balance-Leahy Scale: Zero                             ADL either performed or assessed with clinical judgement   ADL Overall ADL's : Needs assistance/impaired   Eating/Feeding Details (indicate cue type and reason): May benefit from u-cuffor built up handles Grooming: Moderate assistance   Upper Body Bathing: Moderate assistance   Lower Body Bathing: Moderate assistance;Sitting/lateral leans                         General ADL Comments: combed hait while  sitting EOB; Ataxic movemetns make ADL tasks difficult; easlily distracted during ADL     Vision       Perception     Praxis      Cognition Arousal/Alertness: Awake/alert Behavior During Therapy: Restless;Impulsive Overall Cognitive Status: Impaired/Different from baseline Area of Impairment: Attention;Safety/judgement;Awareness;Problem solving                   Current Attention Level: Sustained   Following Commands: Follows one step commands with increased time Safety/Judgement: Decreased awareness of safety;Decreased awareness of deficits Awareness: Emergent Problem Solving: Slow processing;Decreased initiation;Difficulty sequencing;Requires verbal cues;Requires tactile cues          Exercises Other Exercises Other Exercises: rolling;bridging; using arm head movements to lead and incorporate BLE when rolling side - sdie   Shoulder Instructions       General Comments      Pertinent Vitals/ Pain       Pain Assessment: Faces Faces Pain Scale: Hurts a little bit Pain Location: with LE ROM Pain Descriptors / Indicators: Discomfort Pain Intervention(s): Limited activity within patient's tolerance  Home Living                                          Prior Functioning/Environment  Frequency  Min 2X/week        Progress Toward Goals  OT Goals(current goals can now be found in the care plan section)  Progress towards OT goals: Progressing toward goals  Acute Rehab OT Goals Patient Stated Goal: to get better OT Goal Formulation: With patient ADL Goals Pt Will Perform Grooming: with mod assist;bed level Additional ADL Goal #1: Pt will sit EOB with min guard assist for 10 min to participate in ADL activities Additional ADL Goal #2: Pt will follow simple one step commands 50% of the time  Plan Discharge plan remains appropriate    Co-evaluation                 AM-PAC PT "6 Clicks" Daily Activity      Outcome Measure   Help from another person eating meals?: Total Help from another person taking care of personal grooming?: A Lot Help from another person toileting, which includes using toliet, bedpan, or urinal?: Total Help from another person bathing (including washing, rinsing, drying)?: A Lot Help from another person to put on and taking off regular upper body clothing?: Total Help from another person to put on and taking off regular lower body clothing?: Total 6 Click Score: 8    End of Session    OT Visit Diagnosis: Unsteadiness on feet (R26.81);Other abnormalities of gait and mobility (R26.89);Muscle weakness (generalized) (M62.81);Other symptoms and signs involving cognitive function   Activity Tolerance Patient tolerated treatment well   Patient Left in bed;with call bell/phone within reach;with bed alarm set   Nurse Communication Mobility status        Time: [id number] OT Time Calculation (min): 27 min  Charges: OT General Charges $OT Visit: 1 Visit OT Treatments $Self Care/Home Management : 23-37 mins  [facility] Roseana Rhine, OT/L  [phone] 12/15/2017   Garlan Drewes,HILLARY 12/15/2017, 12:55 PM

## 2017-12-15 NOTE — Progress Notes (Signed)
Claire Salinas to be D/C'd Skilled nursing facility per MD order.  Discussed with the patient and all questions fully answered.  VSS, Skin clean, dry and intact without evidence of skin break down, no evidence of skin tears noted. IV catheter discontinued intact. Site without signs and symptoms of complications. Dressing and pressure applied.  [facility] called [facility] [place]. All questions answered.  Pt D/C'd via [facility].  Grayling Congressvan J Jarold Macomber 12/15/2017 12:51 PM

## 2017-12-23 LAB — CHROMOSOME ANALYSIS, BONE MARROW

## 2017-12-28 ENCOUNTER — Encounter (HOSPITAL_COMMUNITY): Payer: Self-pay

## 2018-01-11 ENCOUNTER — Encounter: Payer: Self-pay | Admitting: Hematology

## 2018-06-27 IMAGING — MR MR HEAD W/O CM
9 of 10 series · 37 of 48 positions shown · non-contrast
Comparison: 12/03/2017 CT head.

CLINICAL DATA: 51 y/o F; generalized weakness, TTP, acute kidney
injury, acute encephalopathy.

EXAM:
MRI HEAD WITHOUT CONTRAST
TECHNIQUE: Multiplanar, multiecho pulse sequences of the brain and surrounding
structures were obtained without intravenous contrast.

[Series 3: DWI · axial · 3.0mm · 0.94mm/px · z∈[-94,+44]mm · 9 of 98 slices shown (1 of 2)]
[im 1/98]
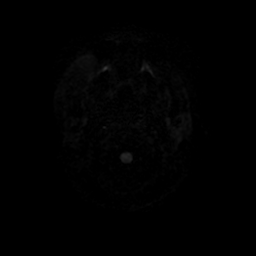
[im 13/98]
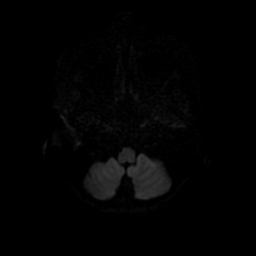
[im 25/98]
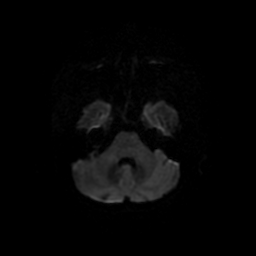
[im 37/98]
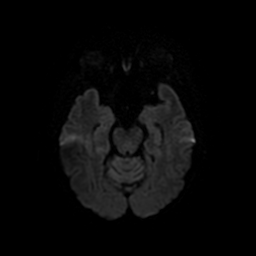
[im 49/98]
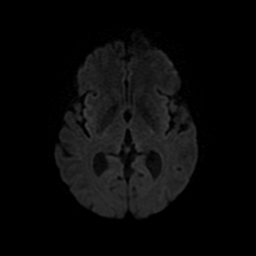
[im 61/98]
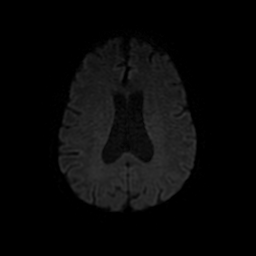
[im 73/98]
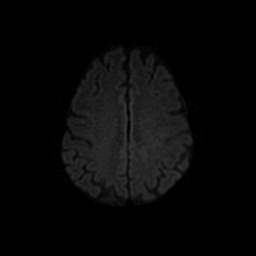
[im 85/98]
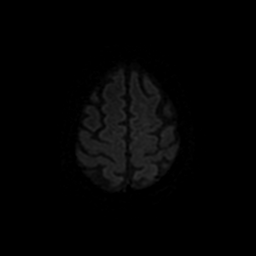
[im 98/98]
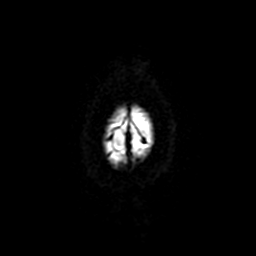

[Series 4: FLAIR · axial · 5.0mm · 0.94mm/px · z∈[-94,+44]mm · 2 of 25 slices shown (1 of 2)]
[im 1/25]
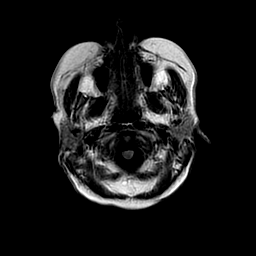
[im 25/25]
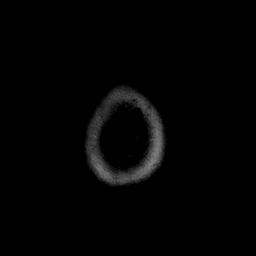

[Series 5: (person_name) · axial · 3.0mm · 0.47mm/px · z∈[-95,-32]mm · 4 of 100 slices shown]
[im 1/100]
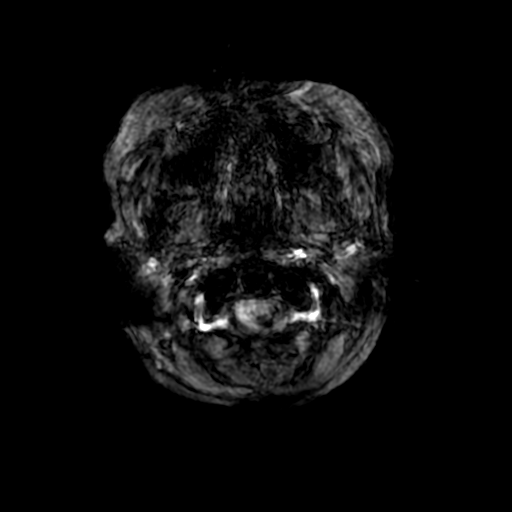
[im 12/100]
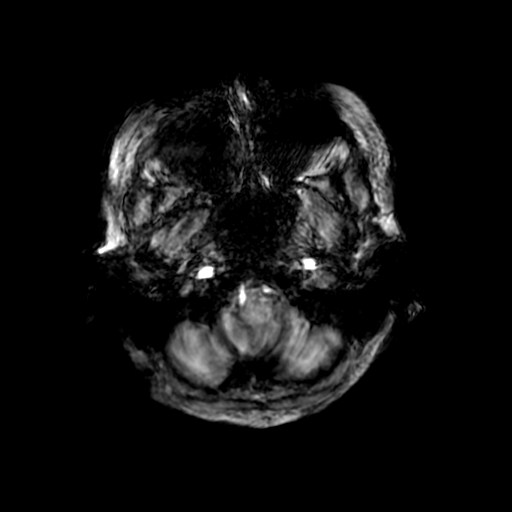
[im 34/100]
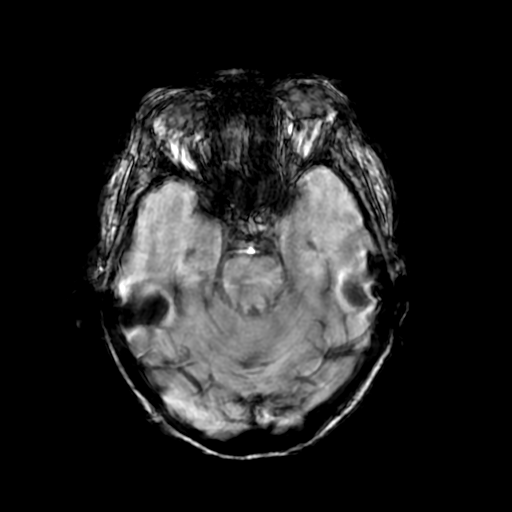
[im 45/100]
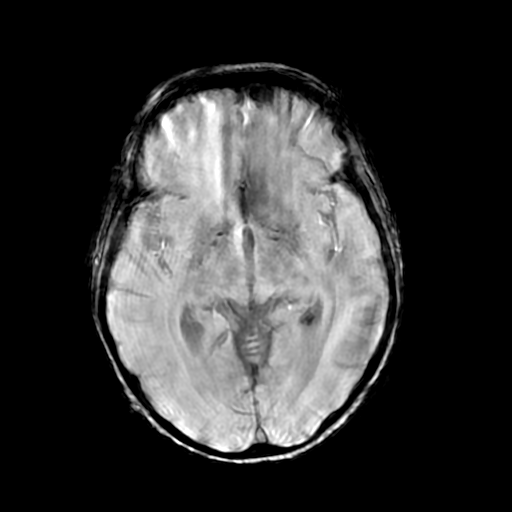

[Series 7: T2 · axial · 5.0mm · 0.47mm/px · z∈[-94,+44]mm · 2 of 25 slices shown (1 of 2)]
[im 1/25]
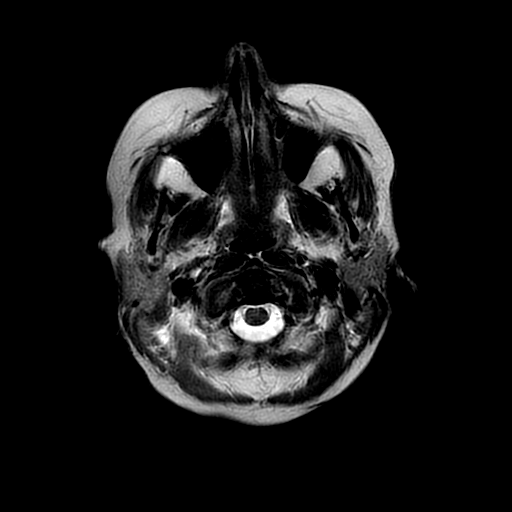
[im 25/25]
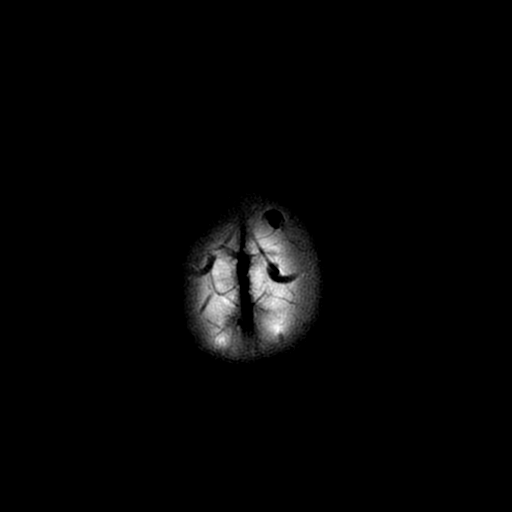

[Series 8: DWI · coronal · 4.0mm · 0.94mm/px · 7 of 70 slices shown (2 of 2)]
[im 1/70]
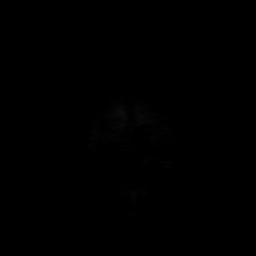
[im 12/70]
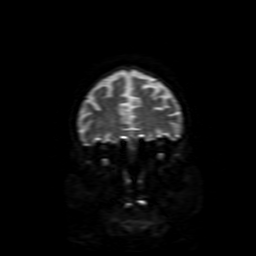
[im 24/70]
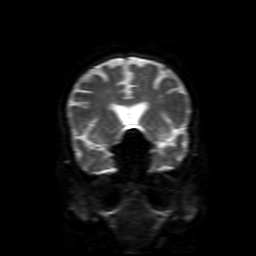
[im 35/70]
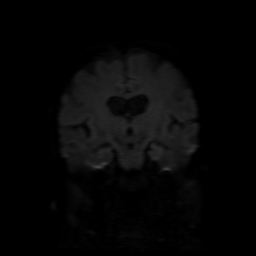
[im 47/70]
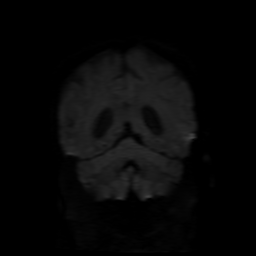
[im 58/70]
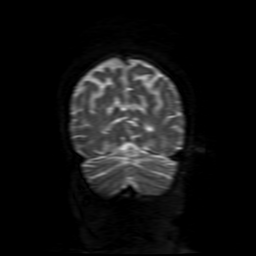
[im 70/70]
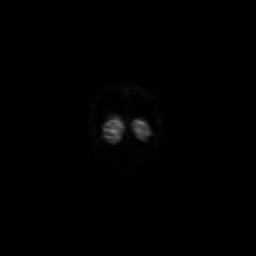

[Series 9: FLAIR · sagittal · 5.0mm · 0.47mm/px · 2 of 23 slices shown (2 of 2)]
[im 1/23]
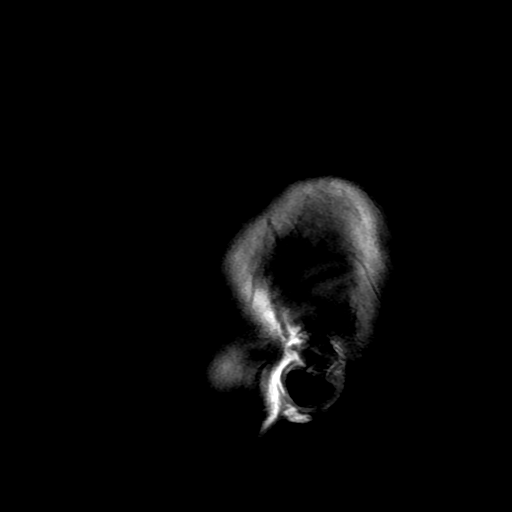
[im 23/23]
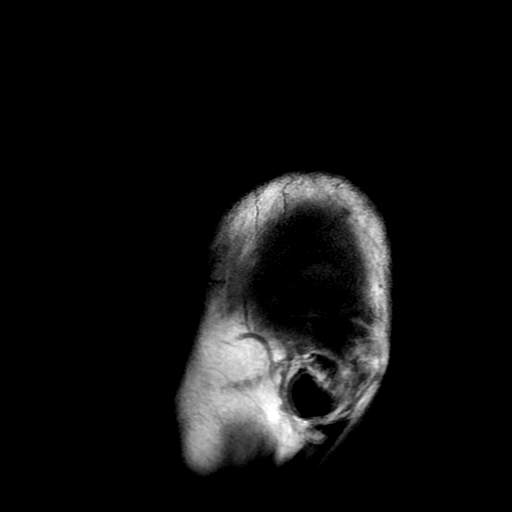

[Series 11: T2 · coronal · 5.0mm · 0.43mm/px · 3 of 29 slices shown (2 of 2)]
[im 1/29]
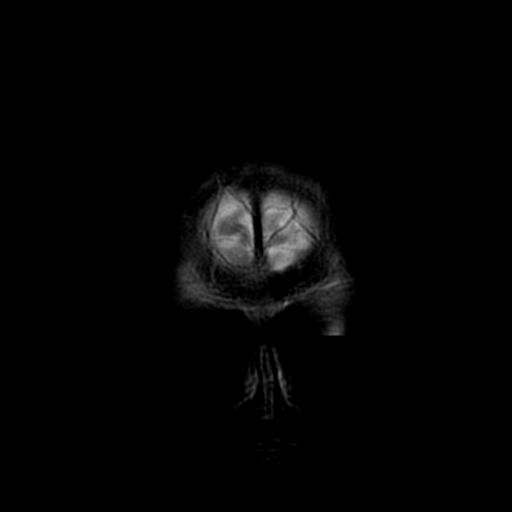
[im 15/29]
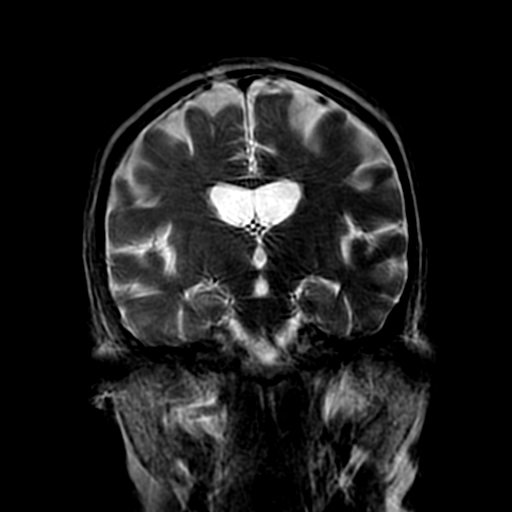
[im 29/29]
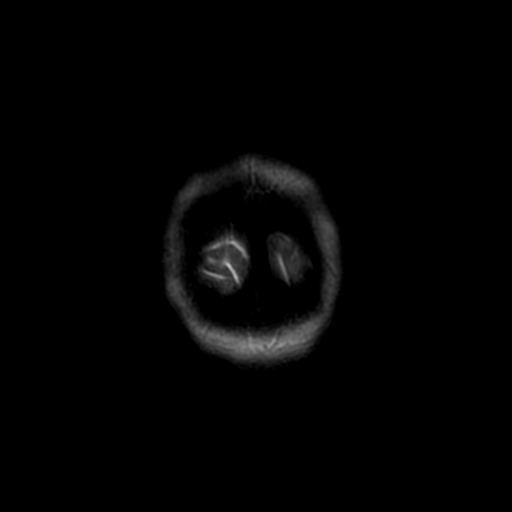

[Series 350: ADC · axial · 3.0mm · 0.94mm/px · z∈[-94,+44]mm · 5 of 49 slices shown (1 of 2)]
[im 1/49]
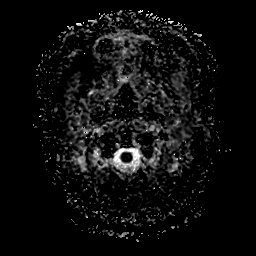
[im 13/49]
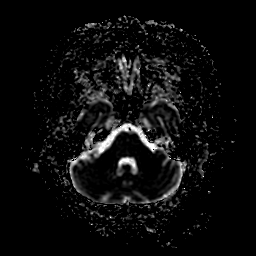
[im 25/49]
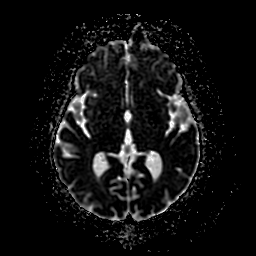
[im 37/49]
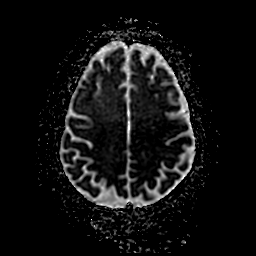
[im 49/49]
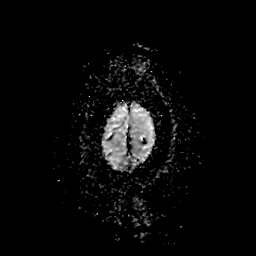

[Series 850: ADC · coronal · 4.0mm · 0.94mm/px · 3 of 35 slices shown (2 of 2)]
[im 1/35]
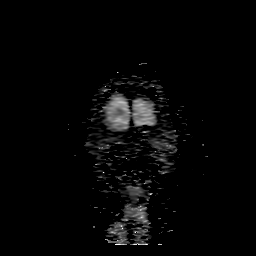
[im 18/35]
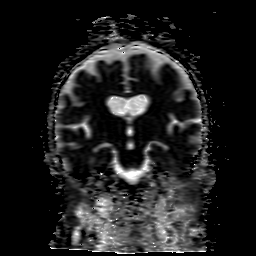
[im 35/35]
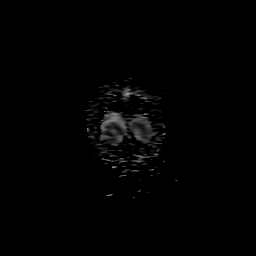

[37 of 48 positions shown; findings below may reference images not displayed]

FINDINGS: Brain: No acute infarction, hemorrhage, hydrocephalus, extra-axial
collection or mass lesion. Few nonspecific foci of T2 FLAIR
hyperintense signal abnormality in subcortical and periventricular
white matter are compatible with mild chronic microvascular ischemic
changes for age. Mild brain parenchymal volume loss.

Vascular: Normal flow voids.

Skull and upper cervical spine: Normal marrow signal.

Sinuses/Orbits: Negative.

Other: None.
IMPRESSION: 1. No acute intracranial abnormality identified.
2. Mild chronic microvascular ischemic changes and mild parenchymal
volume loss of the brain.

By: Zandisile Bridger M.D.

## 2024-10-31 DEATH — deceased
